# Patient Record
Sex: Male | Born: 1990 | Race: White | Hispanic: No | Marital: Married | Smoking: Current some day smoker
Health system: Southern US, Community
[De-identification: ages and names within clinical notes are randomized; demographics above are authoritative.]

## PROBLEM LIST (undated history)

## (undated) DIAGNOSIS — R030 Elevated blood-pressure reading, without diagnosis of hypertension: Secondary | ICD-10-CM

## (undated) DIAGNOSIS — I10 Essential (primary) hypertension: Secondary | ICD-10-CM

## (undated) HISTORY — DX: Essential (primary) hypertension: I10

## (undated) HISTORY — PX: FOOT SURGERY: SHX648

## (undated) HISTORY — PX: INCISION AND DRAINAGE ABSCESS: SHX5864

---
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
## 2013-07-13 ENCOUNTER — Emergency Department (HOSPITAL_COMMUNITY)
Admission: EM | Admit: 2013-07-13 | Discharge: 2013-07-13 | Disposition: A | Discharge status: Home / Self Care | Payer: Self-pay | Attending: Emergency Medicine | Admitting: Emergency Medicine

## 2013-07-13 ENCOUNTER — Encounter (HOSPITAL_COMMUNITY): Payer: Self-pay | Admitting: Emergency Medicine

## 2013-07-13 ENCOUNTER — Emergency Department (HOSPITAL_COMMUNITY): Payer: Self-pay

## 2013-07-13 DIAGNOSIS — R55 Syncope and collapse: Secondary | ICD-10-CM | POA: Insufficient documentation

## 2013-07-13 DIAGNOSIS — Y9389 Activity, other specified: Secondary | ICD-10-CM | POA: Insufficient documentation

## 2013-07-13 DIAGNOSIS — T401X1A Poisoning by heroin, accidental (unintentional), initial encounter: Secondary | ICD-10-CM | POA: Insufficient documentation

## 2013-07-13 DIAGNOSIS — Y929 Unspecified place or not applicable: Secondary | ICD-10-CM | POA: Insufficient documentation

## 2013-07-13 DIAGNOSIS — T401X4A Poisoning by heroin, undetermined, initial encounter: Secondary | ICD-10-CM | POA: Insufficient documentation

## 2013-07-13 DIAGNOSIS — R404 Transient alteration of awareness: Secondary | ICD-10-CM | POA: Insufficient documentation

## 2013-07-13 LAB — BASIC METABOLIC PANEL
Chloride: 104 mEq/L (ref 96–112)
GFR calc Af Amer: >90 mL/min (ref >90)
GFR calc non Af Amer: >90 mL/min (ref >90)
Potassium: 3.6 mEq/L (ref 3.5–5.1)
Sodium: 140 mEq/L (ref 135–145)

## 2013-07-13 LAB — CBC
Hemoglobin: 14.5 g/dL (ref 13.0–17.0)
MCHC: 33.6 g/dL (ref 30.0–36.0)
RDW: 13.6 % (ref 11.5–15.5)
WBC: 17.9 10*3/uL — ABNORMAL HIGH (ref 4.0–10.5)

## 2013-07-13 LAB — SALICYLATE LEVEL: Salicylate Lvl: <2 mg/dL — ABNORMAL LOW (ref 2.8–20.0)

## 2013-07-13 LAB — RAPID URINE DRUG SCREEN, HOSP PERFORMED
Barbiturates: NOT DETECTED
Benzodiazepines: NOT DETECTED
Cocaine: NOT DETECTED

## 2013-07-13 LAB — ACETAMINOPHEN LEVEL: Acetaminophen (Tylenol), Serum: <15 ug/mL (ref 10–30)

## 2013-07-13 LAB — POCT I-STAT TROPONIN I: Troponin i, poc: 0.01 ng/mL (ref 0.00–0.08)

## 2013-07-13 MED ORDER — SODIUM CHLORIDE 0.9 % IV BOLUS (SEPSIS)
1000.0000 mL | Freq: Once | INTRAVENOUS | Status: AC
  Administered 2013-07-13: 1000 mL via INTRAVENOUS

## 2013-07-13 NOTE — ED Notes (Signed)
Pt given soda and crackers per EDP order

## 2013-07-13 NOTE — ED Notes (Signed)
edp to bedside to discuss admission with pt due to low O2 sats on RA, pt remains from 86%-93% on RA with no complaints of SOB. Pt states he does not want to stay he will return if he feels SOB but he must work [employer]

## 2013-07-13 NOTE — ED Notes (Signed)
Pt states he took heroin aroun 730 this morning, sts he does not know how much he took. Pt remembers walking upstairs and then people standing around him on the floor. Pt position was on his back. Pt denies any symptoms, denies CP, SOB, N/V. States he has a slight headache 1/10. Pt in NAD, diaphoretic and tachycardic on monitor.

## 2013-07-13 NOTE — ED Notes (Signed)
Per ems, coming from friends house, found unconscious, shallow respirations, laying on floor. Friend states he overdosed on heroin. EMS had to bag pt, Pt was given 2 IM of narcan, pt first time using heroin. Upon arrival, pt A/O, ambulatory. VSS.

## 2013-07-13 NOTE — ED Provider Notes (Signed)
CSN: [id number]     Arrival date & time 07/13/13  0908 History   First MD Initiated Contact with Patient 07/13/13 0911     No chief complaint on file.  (Consider location/radiation/quality/duration/timing/severity/associated sxs/prior Treatment) HPI Comments: Injected heroin for the first time last night. Found by EMS unresponsive, shallow breathing. Narcan administered.  Patient is a 22 y.o. male presenting with Overdose and syncope. The history is provided by the patient and the EMS personnel.  Drug Overdose This is a new problem. The current episode started 1 to 2 hours ago. The problem occurs constantly. The problem has been resolved. Pertinent negatives include no chest pain, no abdominal pain and no shortness of breath. Nothing aggravates the symptoms. Relieved by: Narcan. He has tried nothing for the symptoms.  Loss of Consciousness Associated symptoms: no chest pain, no fever and no shortness of breath     No past medical history on file. No past surgical history on file. No family history on file. History  Substance Use Topics  . Smoking status: Not on file  . Smokeless tobacco: Not on file  . Alcohol Use: Not on file    Review of Systems  Constitutional: Negative for fever.  Respiratory: Negative for cough and shortness of breath.   Cardiovascular: Positive for syncope. Negative for chest pain.  Gastrointestinal: Negative for abdominal pain.  All other systems reviewed and are negative.    Allergies  Review of patient's allergies indicates no known allergies.  Home Medications  No current outpatient prescriptions on file. SpO2 100% Physical Exam  Nursing note and vitals reviewed. Constitutional: He is oriented to person, place, and time. He appears well-developed and well-nourished. No distress.  HENT:  Head: Normocephalic and atraumatic.  Mouth/Throat: No oropharyngeal exudate.  Eyes: EOM are normal. Pupils are equal, round, and reactive to light.  Neck:  Normal range of motion. Neck supple.  Cardiovascular: Normal rate and regular rhythm.  Exam reveals no friction rub.   No murmur heard. Pulmonary/Chest: Effort normal and breath sounds normal. No respiratory distress. He has no wheezes. He has no rales.  Abdominal: He exhibits no distension. There is no tenderness. There is no rebound.  Musculoskeletal: Normal range of motion. He exhibits no edema.  Neurological: He is alert and oriented to person, place, and time.  Skin: He is not diaphoretic.    ED Course  Procedures (including critical care time) Labs Review Labs Reviewed - No data to display Imaging Review No results found.  EKG Interpretation    Date/Time:  Monday July 13 2013 09:23:28 EST Ventricular Rate:  107 PR Interval:  179 QRS Duration: 102 QT Interval:  365 QTC Calculation: 487 R Axis:   69 Text Interpretation:  Sinus tachycardia Borderline ST elevation, anterolateral leads, c/w benign early repolarization Borderline prolonged QT interval Baseline wander in lead(s) V2 V3 Confirmed by Gwendolyn Grant  MD, Meron Bocchino ([id number]) on 07/13/2013 11:32:37 AM            MDM   1. Heroin overdose, initial encounter    57M here for overdose. Injected heroin for the first time this morning. Found by EMS with AMS, bradypnea, pinpoint pupils. 2mg  of Narcan given IM with good response. Here, alert, denies any SI, HI. Denies any other drug ingestion. Tachycardic on the monitor, satting in the low 90s with 2 L [place]. Exam otherwise benign. EKG with J-point elevation anteriorly, no ST depression, likely secondary to BER. Patient relaxing comfortably, no further signs of opiate intoxication. Alert and oriented. Patient  sleeps, but is easily arousable. He did endorse staying up most of the night partying. No pinpoint pupils. While observing patient, he was noted to have low O2 sats at many times while sleeping. Patient would wake up, take some deep breaths, and his sats would improve. CXR clear.  Upon re-exam, sats would hover in the high 80s. I explained he would like to observe him with an admission due to his hypoxia. He does not want to stay. He is alert, oriented, and aware of the risks. He signed AMA paperwork. He does not want to commit suicide, no need for Psych eval. Given discharge paperwork.     Dagmar Hait, MD 07/14/13 [phone]

## 2013-08-17 ENCOUNTER — Emergency Department (HOSPITAL_COMMUNITY)
Admission: EM | Admit: 2013-08-17 | Discharge: 2013-08-17 | Disposition: A | Discharge status: Home / Self Care | Payer: Self-pay | Attending: Emergency Medicine | Admitting: Emergency Medicine

## 2013-08-17 ENCOUNTER — Encounter (HOSPITAL_COMMUNITY): Payer: Self-pay | Admitting: Emergency Medicine

## 2013-08-17 DIAGNOSIS — Y939 Activity, unspecified: Secondary | ICD-10-CM | POA: Insufficient documentation

## 2013-08-17 DIAGNOSIS — Y929 Unspecified place or not applicable: Secondary | ICD-10-CM | POA: Insufficient documentation

## 2013-08-17 DIAGNOSIS — T401X1A Poisoning by heroin, accidental (unintentional), initial encounter: Secondary | ICD-10-CM | POA: Insufficient documentation

## 2013-08-17 DIAGNOSIS — F17200 Nicotine dependence, unspecified, uncomplicated: Secondary | ICD-10-CM | POA: Insufficient documentation

## 2013-08-17 DIAGNOSIS — T401X4A Poisoning by heroin, undetermined, initial encounter: Secondary | ICD-10-CM | POA: Insufficient documentation

## 2013-08-17 LAB — CBC WITH DIFFERENTIAL/PLATELET
Basophils Relative: 0 % (ref 0–1)
Eosinophils Absolute: 0.3 10*3/uL (ref 0.0–0.7)
Hemoglobin: 15.5 g/dL (ref 13.0–17.0)
Lymphs Abs: 2.4 10*3/uL (ref 0.7–4.0)
MCH: 29.4 pg (ref 26.0–34.0)
MCV: 86 fL (ref 78.0–100.0)
Monocytes Absolute: 1 10*3/uL (ref 0.1–1.0)
Monocytes Relative: 7 % (ref 3–12)
Neutrophils Relative %: 74 % (ref 43–77)
RBC: 5.28 MIL/uL (ref 4.22–5.81)
RDW: 13.3 % (ref 11.5–15.5)
WBC: 14 10*3/uL — ABNORMAL HIGH (ref 4.0–10.5)

## 2013-08-17 LAB — COMPREHENSIVE METABOLIC PANEL
ALT: 78 U/L — ABNORMAL HIGH (ref 0–53)
Albumin: 4 g/dL (ref 3.5–5.2)
Alkaline Phosphatase: 44 U/L (ref 39–117)
BUN: 13 mg/dL (ref 6–23)
CO2: 28 mEq/L (ref 19–32)
GFR calc Af Amer: >90 mL/min (ref >90)
GFR calc non Af Amer: >90 mL/min (ref >90)
Glucose, Bld: 192 mg/dL — ABNORMAL HIGH (ref 70–99)
Potassium: 3.6 mEq/L (ref 3.5–5.1)
Total Bilirubin: 0.2 mg/dL — ABNORMAL LOW (ref 0.3–1.2)
Total Protein: 7.4 g/dL (ref 6.0–8.3)

## 2013-08-17 LAB — SALICYLATE LEVEL: Salicylate Lvl: <2 mg/dL — ABNORMAL LOW (ref 2.8–20.0)

## 2013-08-17 LAB — ACETAMINOPHEN LEVEL: Acetaminophen (Tylenol), Serum: <15 ug/mL (ref 10–30)

## 2013-08-17 LAB — RAPID URINE DRUG SCREEN, HOSP PERFORMED
Amphetamines: NOT DETECTED
Barbiturates: NOT DETECTED
Benzodiazepines: POSITIVE — AB
Tetrahydrocannabinol: POSITIVE — AB

## 2013-08-17 LAB — ETHANOL: Alcohol, Ethyl (B): <11 mg/dL (ref 0–11)

## 2013-08-17 MED ORDER — NALOXONE HCL 1 MG/ML IJ SOLN
1.5000 mg/h | INTRAVENOUS | Status: DC
  Administered 2013-08-17: 1.5 mg/h via INTRAVENOUS
  Filled 2013-08-17: qty 4

## 2013-08-17 NOTE — ED Notes (Addendum)
Presents with accidental heroin overdose , took a little more than usual. Given 2 mg intranasal of narcan due to respiratory depression by fire and EMS. Upon arrival pt is alert, airway intact, VSS.  Sats 95% on 2 L O2. IV heroin user.

## 2013-08-17 NOTE — ED Provider Notes (Signed)
CSN: [id number]     Arrival date & time 08/17/13  0122 History   First MD Initiated Contact with Patient 08/17/13 0134     Chief Complaint  Patient presents with  . Drug Overdose   (Consider location/radiation/quality/duration/timing/severity/associated sxs/prior Treatment) Patient is a 22 y.o. male presenting with Overdose. The history is provided by the patient.  Drug Overdose  He accidentally overdosed 1 intravenous heroin. He took heroin at about 12:30 AM. He was found by EMS and given naloxone 2 mg with improvement. He denies using any other drugs tonight although he has used marijuana in the past. He had also been seen in the ED for her one overdose about one month ago. He has no complaints currently.  History reviewed. No pertinent past medical history. Past Surgical History  Procedure Laterality Date  . Foot surgery     History reviewed. No pertinent family history. History  Substance Use Topics  . Smoking status: Current Some Day Smoker  . Smokeless tobacco: Not on file  . Alcohol Use: Yes     Comment: not regularly    Review of Systems  All other systems reviewed and are negative.    Allergies  Review of patient's allergies indicates no known allergies.  Home Medications  No current outpatient prescriptions on file. BP 126/72  Pulse 113  Temp(Src) 98.9 F (37.2 C) (Oral)  Resp 14  SpO2 96% Physical Exam  Nursing note and vitals reviewed.  22 year old male, resting comfortably and in no acute distress. Vital signs are significant for tachycardia with heart rate of 113. Oxygen saturation is 96%, which is normal. Head is normocephalic and atraumatic. PERRLA, EOMI. Oropharynx is clear. Neck is nontender and supple without adenopathy or JVD. Back is nontender and there is no CVA tenderness. Lungs are clear without rales, wheezes, or rhonchi. Chest is nontender. Heart has regular rate and rhythm without murmur. Abdomen is soft, flat, nontender without masses  or hepatosplenomegaly and peristalsis is normoactive. Extremities have no cyanosis or edema, full range of motion is present. Skin is warm and dry without rash. Neurologic: Mental status is normal, cranial nerves are intact, there are no motor or sensory deficits.  ED Course  Procedures (including critical care time) Labs Review Results for orders placed during the hospital encounter of 08/17/13  CBC WITH DIFFERENTIAL      Result Value Range   WBC 14.0 (*) 4.0 - 10.5 K/uL   RBC 5.28  4.22 - 5.81 MIL/uL   Hemoglobin 15.5  13.0 - 17.0 g/dL   HCT 40.9  81.1 - 91.4 %   MCV 86.0  78.0 - 100.0 fL   MCH 29.4  26.0 - 34.0 pg   MCHC 34.1  30.0 - 36.0 g/dL   RDW 78.2  95.6 - 21.3 %   Platelets 313  150 - 400 K/uL   Neutrophils Relative % 74  43 - 77 %   Neutro Abs 10.3 (*) 1.7 - 7.7 K/uL   Lymphocytes Relative 17  12 - 46 %   Lymphs Abs 2.4  0.7 - 4.0 K/uL   Monocytes Relative 7  3 - 12 %   Monocytes Absolute 1.0  0.1 - 1.0 K/uL   Eosinophils Relative 2  0 - 5 %   Eosinophils Absolute 0.3  0.0 - 0.7 K/uL   Basophils Relative 0  0 - 1 %   Basophils Absolute 0.1  0.0 - 0.1 K/uL  COMPREHENSIVE METABOLIC PANEL      Result  Value Range   Sodium 140  135 - 145 mEq/L   Potassium 3.6  3.5 - 5.1 mEq/L   Chloride 100  96 - 112 mEq/L   CO2 28  19 - 32 mEq/L   Glucose, Bld 192 (*) 70 - 99 mg/dL   BUN 13  6 - 23 mg/dL   Creatinine, Ser 1.61  0.50 - 1.35 mg/dL   Calcium 9.0  8.4 - 09.6 mg/dL   Total Protein 7.4  6.0 - 8.3 g/dL   Albumin 4.0  3.5 - 5.2 g/dL   AST 70 (*) 0 - 37 U/L   ALT 78 (*) 0 - 53 U/L   Alkaline Phosphatase 44  39 - 117 U/L   Total Bilirubin 0.2 (*) 0.3 - 1.2 mg/dL   GFR calc non Af Amer >90  >90 mL/min   GFR calc Af Amer >90  >90 mL/min  ETHANOL      Result Value Range   Alcohol, Ethyl (B) <11  0 - 11 mg/dL  SALICYLATE LEVEL      Result Value Range   Salicylate Lvl <2.0 (*) 2.8 - 20.0 mg/dL  ACETAMINOPHEN LEVEL      Result Value Range   Acetaminophen (Tylenol),  Serum <15.0  10 - 30 ug/mL   CRITICAL CARE Performed by: EAVWU,JWJXB Total critical care time: 60 minutes Critical care time was exclusive of separately billable procedures and treating other patients. Critical care was necessary to treat or prevent imminent or life-threatening deterioration. Critical care was time spent personally by me on the following activities: development of treatment plan with patient and/or surrogate as well as nursing, discussions with consultants, evaluation of patient's response to treatment, examination of patient, obtaining history from patient or surrogate, ordering and performing treatments and interventions, ordering and review of laboratory studies, ordering and review of radiographic studies, pulse oximetry and re-evaluation of patient's condition.  MDM   1. Heroin overdose, initial encounter    Accidental heroin overdose. Because half-life of heroin is significantly longer than the half life of naloxone, he will need to be started on a naloxone drip. He wanted to be observed in the ED. Old records are reviewed and when he presented last month with her one overdose, hospital admission was recommended and he left AGAINST MEDICAL ADVICE. The patient states that he left because he had to go to work.  He was maintained on a naloxone drip for 4 hours. Following that, the naloxone drip was discontinued and he was observed in the ED for an additional hour. He is not showing any signs of sedation or hypoxia and at this point can be safely discharged. He is urged not to use heroin again. He is given resource guide to help find appropriate rehabilitation to keep him from going back to her when use.  Dione Booze, MD 08/17/13 [phone]

## 2013-09-11 ENCOUNTER — Emergency Department (HOSPITAL_COMMUNITY)
Admission: EM | Admit: 2013-09-11 | Discharge: 2013-09-11 | Disposition: A | Discharge status: Home / Self Care | Payer: Self-pay | Attending: Emergency Medicine | Admitting: Emergency Medicine

## 2013-09-11 ENCOUNTER — Other Ambulatory Visit: Payer: Self-pay

## 2013-09-11 ENCOUNTER — Encounter (HOSPITAL_COMMUNITY): Payer: Self-pay | Admitting: Emergency Medicine

## 2013-09-11 DIAGNOSIS — Y9389 Activity, other specified: Secondary | ICD-10-CM | POA: Insufficient documentation

## 2013-09-11 DIAGNOSIS — T40901A Poisoning by unspecified psychodysleptics [hallucinogens], accidental (unintentional), initial encounter: Secondary | ICD-10-CM | POA: Insufficient documentation

## 2013-09-11 DIAGNOSIS — IMO0002 Reserved for concepts with insufficient information to code with codable children: Secondary | ICD-10-CM | POA: Insufficient documentation

## 2013-09-11 DIAGNOSIS — F19929 Other psychoactive substance use, unspecified with intoxication, unspecified: Secondary | ICD-10-CM

## 2013-09-11 DIAGNOSIS — T40904A Poisoning by unspecified psychodysleptics [hallucinogens], undetermined, initial encounter: Secondary | ICD-10-CM | POA: Insufficient documentation

## 2013-09-11 DIAGNOSIS — Y929 Unspecified place or not applicable: Secondary | ICD-10-CM | POA: Insufficient documentation

## 2013-09-11 DIAGNOSIS — Z23 Encounter for immunization: Secondary | ICD-10-CM | POA: Insufficient documentation

## 2013-09-11 DIAGNOSIS — R000 Tachycardia, unspecified: Secondary | ICD-10-CM | POA: Insufficient documentation

## 2013-09-11 DIAGNOSIS — F911 Conduct disorder, childhood-onset type: Secondary | ICD-10-CM | POA: Insufficient documentation

## 2013-09-11 LAB — URINALYSIS, ROUTINE W REFLEX MICROSCOPIC
BILIRUBIN URINE: NEGATIVE
Glucose, UA: NEGATIVE mg/dL
HGB URINE DIPSTICK: NEGATIVE
KETONES UR: NEGATIVE mg/dL
Leukocytes, UA: NEGATIVE
Nitrite: NEGATIVE
PROTEIN: NEGATIVE mg/dL
Specific Gravity, Urine: 1.021 (ref 1.005–1.030)
UROBILINOGEN UA: 0.2 mg/dL (ref 0.0–1.0)
pH: 7.5 (ref 5.0–8.0)

## 2013-09-11 LAB — CK: Total CK: 205 U/L (ref 7–232)

## 2013-09-11 LAB — CBC WITH DIFFERENTIAL/PLATELET
BASOS PCT: 0 % (ref 0–1)
Basophils Absolute: 0 10*3/uL (ref 0.0–0.1)
EOS ABS: 0.2 10*3/uL (ref 0.0–0.7)
Eosinophils Relative: 1 % (ref 0–5)
HCT: 47 % (ref 39.0–52.0)
Hemoglobin: 16 g/dL (ref 13.0–17.0)
Lymphocytes Relative: 17 % (ref 12–46)
Lymphs Abs: 3.4 10*3/uL (ref 0.7–4.0)
MCH: 29 pg (ref 26.0–34.0)
MCHC: 34 g/dL (ref 30.0–36.0)
MCV: 85.3 fL (ref 78.0–100.0)
MONOS PCT: 7 % (ref 3–12)
Monocytes Absolute: 1.3 10*3/uL — ABNORMAL HIGH (ref 0.1–1.0)
NEUTROS PCT: 76 % (ref 43–77)
Neutro Abs: 15.1 10*3/uL — ABNORMAL HIGH (ref 1.7–7.7)
Platelets: 288 10*3/uL (ref 150–400)
RBC: 5.51 MIL/uL (ref 4.22–5.81)
RDW: 13.6 % (ref 11.5–15.5)
WBC: 19.9 10*3/uL — ABNORMAL HIGH (ref 4.0–10.5)

## 2013-09-11 LAB — RAPID URINE DRUG SCREEN, HOSP PERFORMED
Amphetamines: NOT DETECTED
BARBITURATES: NOT DETECTED
Benzodiazepines: NOT DETECTED
Cocaine: NOT DETECTED
Opiates: NOT DETECTED
TETRAHYDROCANNABINOL: POSITIVE — AB

## 2013-09-11 LAB — POCT I-STAT, CHEM 8
BUN: 14 mg/dL (ref 6–23)
Calcium, Ion: 1.18 mmol/L (ref 1.12–1.23)
Chloride: 109 mEq/L (ref 96–112)
Creatinine, Ser: 1 mg/dL (ref 0.50–1.35)
Glucose, Bld: 130 mg/dL — ABNORMAL HIGH (ref 70–99)
HCT: 51 % (ref 39.0–52.0)
HEMOGLOBIN: 17.3 g/dL — AB (ref 13.0–17.0)
POTASSIUM: 3.9 meq/L (ref 3.7–5.3)
SODIUM: 144 meq/L (ref 137–147)
TCO2: 22 mmol/L (ref 0–100)

## 2013-09-11 LAB — COMPREHENSIVE METABOLIC PANEL
ALT: 63 U/L — AB (ref 0–53)
AST: 38 U/L — ABNORMAL HIGH (ref 0–37)
Albumin: 4.7 g/dL (ref 3.5–5.2)
Alkaline Phosphatase: 48 U/L (ref 39–117)
BUN: 14 mg/dL (ref 6–23)
CO2: 22 meq/L (ref 19–32)
Calcium: 10.3 mg/dL (ref 8.4–10.5)
Chloride: 105 mEq/L (ref 96–112)
Creatinine, Ser: 0.91 mg/dL (ref 0.50–1.35)
Glucose, Bld: 126 mg/dL — ABNORMAL HIGH (ref 70–99)
POTASSIUM: 4.2 meq/L (ref 3.7–5.3)
Sodium: 145 mEq/L (ref 137–147)
Total Bilirubin: 0.5 mg/dL (ref 0.3–1.2)
Total Protein: 8.3 g/dL (ref 6.0–8.3)

## 2013-09-11 LAB — GLUCOSE, CAPILLARY: Glucose-Capillary: 114 mg/dL — ABNORMAL HIGH (ref 70–99)

## 2013-09-11 LAB — TROPONIN I

## 2013-09-11 LAB — ETHANOL

## 2013-09-11 LAB — CG4 I-STAT (LACTIC ACID): LACTIC ACID, VENOUS: 2.65 mmol/L — AB (ref 0.5–2.2)

## 2013-09-11 MED ORDER — LORAZEPAM 2 MG/ML IJ SOLN
1.0000 mg | Freq: Once | INTRAMUSCULAR | Status: AC
  Administered 2013-09-11: 1 mg via INTRAVENOUS

## 2013-09-11 MED ORDER — SODIUM CHLORIDE 0.9 % IV SOLN
1000.0000 mL | Freq: Once | INTRAVENOUS | Status: AC
  Administered 2013-09-11: 1000 mL via INTRAVENOUS

## 2013-09-11 MED ORDER — NICOTINE 14 MG/24HR TD PT24
14.0000 mg | MEDICATED_PATCH | Freq: Once | TRANSDERMAL | Status: DC
  Administered 2013-09-11: 14 mg via TRANSDERMAL
  Filled 2013-09-11: qty 1

## 2013-09-11 MED ORDER — TETANUS-DIPHTH-ACELL PERTUSSIS 5-2.5-18.5 LF-MCG/0.5 IM SUSP
0.5000 mL | Freq: Once | INTRAMUSCULAR | Status: AC
  Administered 2013-09-11: 0.5 mL via INTRAMUSCULAR
  Filled 2013-09-11: qty 0.5

## 2013-09-11 MED ORDER — SODIUM CHLORIDE 0.9 % IV SOLN
1000.0000 mL | INTRAVENOUS | Status: DC
  Administered 2013-09-11: 1000 mL via INTRAVENOUS

## 2013-09-11 MED ORDER — LORAZEPAM 2 MG/ML IJ SOLN
INTRAMUSCULAR | Status: AC
  Filled 2013-09-11: qty 1

## 2013-09-11 NOTE — ED Notes (Signed)
Still won't share information on who he is, where he is, etc.

## 2013-09-11 NOTE — ED Notes (Signed)
Pt taken out handcuffs at this time. More alert and responding to staff.

## 2013-09-11 NOTE — Discharge Instructions (Signed)
°Emergency Department Resource Guide °1) Find a Doctor and Pay Out of Pocket °Although you won't have to find out who is covered by your insurance plan, it is a good idea to ask around and get recommendations. You will then need to call the office and see if the doctor you have chosen will accept you as a new patient and what types of options they offer for patients who are self-pay. Some doctors offer discounts or will set up payment plans for their patients who do not have insurance, but you will need to ask so you aren't surprised when you get to your appointment. ° °2) Contact Your Local Health Department °Not all health departments have doctors that can see patients for sick visits, but many do, so it is worth a call to see if yours does. If you don't know where your local health department is, you can check in your phone book. The CDC also has a tool to help you locate your state's health department, and many state websites also have listings of all of their local health departments. ° °3) Find a Walk-in Clinic °If your illness is not likely to be very severe or complicated, you may want to try a walk in clinic. These are popping up all over the country in pharmacies, drugstores, and shopping centers. They're usually staffed by nurse practitioners or physician assistants that have been trained to treat common illnesses and complaints. They're usually fairly quick and inexpensive. However, if you have serious medical issues or chronic medical problems, these are probably not your best option. ° °No Primary Care Doctor: °- Call [facility] at  832-8000 - they can help you locate a primary care doctor that  accepts your insurance, provides certain services, etc. °- Physician Referral Service- 1-800-533-3463 ° °Chronic Pain Problems: °Organization         Address  Phone   Notes  °[facility]  (336) 297-2271 Patients need to be referred by their primary care doctor.  ° °Medication  Assistance: °Organization         Address  Phone   Notes  °[place] [facility] [address] °[place], [place] [address] (336) 641-8030 --Must be a resident of [place] °-- Must have NO insurance coverage whatsoever (no Medicaid/ Medicare, etc.) °-- The pt. MUST have a primary care doctor that directs their care regularly and follows them in the community °  °[facility]  (866) 331-1348   °[employer]  (888) 892-1162   ° °Agencies that provide inexpensive medical care: °Organization         Address  Phone   Notes  °[facility]  (336) 832-8035   °[facility]    (336) 832-7272   °[facility] [address] °[place], [place] [address] (336) 832-4777   °[facility] [address], °[place] (336) 271-4999   °[facility]    (336) 373-0678   °[facility]    (336) 272-1050   °[facility] ° [address], [place] Phone:  (336) 832-4444, Fax:  (336) 832-4440 Hours of Operation:  9 am - 6 pm, M-F.  Also accepts Medicaid/Medicare and self-pay.  °[facility] ° [address], [place] Phone: (336) 832-3150, Fax: (336) [phone]. Hours of Operation:  8:30 am - 5:30 pm, M-F.  Also accepts Medicaid and self-pay.  °[facility] [place] [address], [place] Phone: (336) 878-6027   °[facility] [address], [place], [place] (336)723-1848, Ext. 123 Mondays & Thursdays: 7-9 AM.  First 15 patients are seen on a first come, first serve basis. °  ° °Medicaid-accepting [place] Providers: ° °Organization         Address  Phone   Notes  °[facility] [address], [place] (336) 641-2100 Also accepts self-pay patients.  °[facility] [address], [place] ° (336) 856-9996   °[facility] [address], [place]  (336) 288-8857   °[facility] [address] [place] [address], [place] (336) 299-7000   °Veita Bland [address], [place]  ° (336) 373-1557 Only accepts  Access Medicaid patients after they have their name applied to their card.  ° °Self-Pay (no insurance) in [place]: ° °Organization         Address  Phone   Notes  °Sickle Cell Patients, [facility] [address], [place] (336) 832-1970   °[facility] [address], [place] (336) 832-4400   °[facility] ° [address], [place] (336) 992-4800   °[facility]/Dr. Osei-Bonsu ° [address] [place] [address], [place] or [address], [place] (336) [phone] Phone number for both [place] and [place] locations is the same.  °[facility] [address], [place] (336) 299-0000   °[facility] [address] [place] [address], [place] or [address] (336) 852-7530 °(336) 878-2260   °[facility] [address], [place] (336) 350-1642, phone; (336) 294-5005, fax Sees patients 1st and 3rd Saturday of every month.  Must not qualify for public or private insurance (i.e. Medicaid, Medicare, [place] Health Choice, Veterans' Benefits) • Household income should be no more than 200% of the poverty level •The clinic cannot treat you if you are pregnant or think you are pregnant • Sexually transmitted diseases are not treated at the clinic.  ° ° °Dental Care: °Organization         Address  Phone  Notes  °[place] [facility] [address], [place] (336) 641-6152 Accepts children up to age 21 who are enrolled in Medicaid or [place] Health Choice; pregnant women with a Medicaid card; and children who have applied for Medicaid or [place] Health Choice, but were declined, whose parents can pay a reduced fee at time of service.  °[place]  [facility] [place]  [address], [place] (336) 641-7733 Accepts children up to age 21 who are enrolled in Medicaid or [place] Health Choice; pregnant women with a Medicaid card; and children who have applied for Medicaid or [place] Health Choice, but were declined, whose parents can pay a reduced fee at time of service.  °[facility] ° [address], [place] (336) 641-4533 Patients are seen by appointment only. Walk-ins are not accepted. [facility] will see patients 18 years of age and older. °Monday - Tuesday (8am-5pm) °Most Wednesdays (8:30-5pm) °$30 per visit, cash only  °[facility] ° [address], [place] (336) 641-4533 Patients are seen by appointment only. Walk-ins are not accepted. [facility] will see patients 18 years of age and older. °One   Wednesday Evening (Monthly: Volunteer Based).  $30 per visit, cash only  °[facility]  (919) 537-3737 for adults; Children under age 4, call Graduate Pediatric Dentistry at (919) [phone]. Children aged 4-14, please call (919) [phone] to request a pediatric application. ° Dental services are provided in all areas of dental care including fillings, crowns and bridges, complete and partial dentures, implants, gum treatment, root canals, and extractions. Preventive care is also provided. Treatment is provided to both adults and children. °Patients are selected via a lottery and there is often a waiting list. °  °[facility] [address], °[place] ° (336) 763-8833 [email or web address] °  °[facility] [address], [place], [place] (336)723-1848, Ext. 123 Second and Fourth Thursday of each month, opens at 6:30 AM; Clinic ends at 9 AM.  Patients are seen on a first-come first-served basis, and a limited number are seen during each clinic.  ° °[facility] ° [address], [place], [place] (336) 723-7904    Eligibility Requirements °You must have lived in [place], or [place] counties for at least the last three months. °  You cannot be eligible for state or federal sponsored healthcare insurance, including Veterans Administration, Medicaid, or Medicare. °  You generally cannot be eligible for healthcare insurance through your employer.  °  How to apply: °Eligibility screenings are held every Tuesday and Wednesday afternoon from 1:00 pm until 4:00 pm. You do not need an appointment for the interview!  °[facility] [address], [place], [place] 336-631-2330   °[facility]  336-342-8273   °[facility]  336-703-3100   °[facility]  336-570-6415   ° °Behavioral Health Resources in the Community: °Intensive Outpatient Programs °Organization         Address  Phone  Notes  °[facility] [address], [place], [place] 336-878-6098   °[facility] [address], [place], [place] 336-832-9800   °[facility] [address], [place], [place] ° 336-882-2125   °[facility] [address],  °[place], [place] 1-800-853-5163 or 336-641-4981   °Substance Abuse Resources °Organization         Address  Phone  Notes  °[facility]  336-882-2125   °[facility]  336-784-9470   °[facility]  336-285-9073   °[facility]  336-845-3988   °Residential & Outpatient Substance Abuse Program  1-800-659-3381   °Psychological Services °Organization         Address  Phone  Notes  °[facility]  336- 832-9600   °[facility]  336- 378-7881   °[facility] [address], [place] 1-800-853-5163 or 336-641-4981   ° °Mobile Crisis Teams °Organization         Address  Phone  Notes  °[facility], Mobile Crisis Care Unit  1-877-626-1772   °Assertive °[facility] ° [address]  [place], [place] 336-834-9664   °Sharon DeEsch [address] °[place] 336-554-5454   ° °Self-Help/Support Groups °Organization         Address  Phone             Notes  °[facility] [place] - variety of support groups  336- 373-1402 Call for more information  °Narcotics Anonymous (NA), [facility] [address], °[place]   2 meetings at this location  ° °  Residential Treatment Programs °Organization         Address  Phone  Notes  °[facility] [address],    °[place]  1-866-801-8205   °[facility] ° [address], [place], [place] 704-293-8524   °[facility] [address], [place] 336-845-3988 Admissions: 8am-3pm M-F  °[facility] [address]    °[place], [place] 336-841-1104   °[facility] [address], [place], [place] 336-379-7146   °[facility] [address]  °[place], [place] 336-285-9073   °[facility] - Intensive Outpatient [address], [place], [place] 336-852-3033   °[facility] [address]  °[place], [place] 1-877-615-2722 or 336-784-9470   °[facility] [address] [place], [place] 336-227-7417 Accepts Medicaid  °[facility] [address]  °[place] 1-800-659-3381 Substance Abuse/Addiction Treatment  ° °[place] Behavioral Health Resources °Organization         Address  Phone  Notes  °[facility]  (888) 581-9988   °Julie Brannon, PhD [address] [place], [place]   (336) 349-5553 or (336) 951-0000   °[facility]   [address] °[place], [place] (336) 349-4454   °[facility] [address], [place], [place] (336) 342-8316 Insurance/Medicaid/sponsorship through [facility]  °[facility] [address]                                    [place], [place] (336) 342-8316 Therapy/tele-psych/case    °[facility] [address]  ° [place], [place] (336) 349-2233    °Dr. Arfeen  (336) 349-4544   °[facility] [place]  [employer] [place] [facility] 1) [address], [place] °2) [address], [place] °3)  [address], [place] (336) 349-3220 °(336) 342-7768 ° °(336) 342-8140   °[place] [facility] (336) 342-1394 or (336) 342-3537 (After Hours)    ° ° °
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
## 2013-09-11 NOTE — ED Notes (Signed)
Pt unable to urinate at this time. MD made aware. If pt unable to void soon will get an in and out per MD.
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
## 2013-09-11 NOTE — ED Notes (Signed)
AC notified of need for sitter at bedside.

## 2013-09-11 NOTE — ED Provider Notes (Signed)
CSN: [id number]     Arrival date & time 09/11/13  0604 History   First MD Initiated Contact with Patient 09/11/13 [phone]     Chief Complaint  Patient presents with  . Altered Mental Status   (Consider location/radiation/quality/duration/timing/severity/associated sxs/prior Treatment) HPI Unknown age white male presents to emergency department via EMS with police escort.  Pt was found naked and agitated, breaking window in local campus apartment. Pt has not been able to communicate much aside from saying "I'm done".  "I don't know".  Pt cannot give name.  He reports doing drugs earlier with friends.  He reports "I am tired of waiting for the pain to stop"  Pt was tazed x 2, handcuffed, and was reportedly spitting, aggressive, and very agitated.   No past medical history on file. No past surgical history on file. No family history on file. History  Substance Use Topics  . Smoking status: Not on file  . Smokeless tobacco: Not on file  . Alcohol Use: Not on file   OB History   No data available     Review of Systems  Unable to perform ROS: Mental status change    Allergies  Review of patient's allergies indicates not on file.  Home Medications  No current outpatient prescriptions on file. There were no vitals taken for this visit. Physical Exam  Nursing note and vitals reviewed. Constitutional: He appears distressed.  Very large and tall gentleman, naked. Dried blood to face, abdomen, right hand, feet.  No source of blood noted aside from minor abrasions to feet.  HENT:  Head: Normocephalic and atraumatic.  Right Ear: External ear normal.  Left Ear: External ear normal.  Nose: Nose normal.  Mouth/Throat: Oropharynx is clear and moist. No oropharyngeal exudate.  Eyes:  Pupils dilated to 6, but reactive  Neck: Normal range of motion. Neck supple. No JVD present. No tracheal deviation present. No thyromegaly present.  Cardiovascular: Regular rhythm, normal heart sounds and intact  distal pulses.  Exam reveals no gallop and no friction rub.   No murmur heard. tachycardia  Pulmonary/Chest: Effort normal and breath sounds normal. No stridor. No respiratory distress. He has no wheezes. He has no rales. He exhibits no tenderness.  Abdominal: Soft. Bowel sounds are normal. He exhibits no distension and no mass. There is no tenderness. There is no rebound and no guarding.  Musculoskeletal: Normal range of motion. He exhibits no edema and no tenderness.  Lymphadenopathy:    He has no cervical adenopathy.  Neurological: He is alert. He exhibits normal muscle tone. Coordination normal.  Skin: Skin is warm and dry. No rash noted. No erythema. No pallor.  Psychiatric:  Unable to assess due to pt refusing or unable to answer questions    ED Course  Procedures (including critical care time) Labs Review Labs Reviewed  GLUCOSE, CAPILLARY  CK  COMPREHENSIVE METABOLIC PANEL  CBC WITH DIFFERENTIAL  URINALYSIS, ROUTINE W REFLEX MICROSCOPIC  URINE RAPID DRUG SCREEN (HOSP PERFORMED)  ETHANOL  POCT I-STAT, CHEM 8  CG4 I-STAT (LACTIC ACID)   Imaging Review No results found.  EKG Interpretation   None       Date: 09/11/2013  Rate: 132  Rhythm: sinus tachycardia  QRS Axis: left  Intervals: normal  ST/T Wave abnormalities: nonspecific ST/T changes  Conduction Disutrbances:none  Narrative Interpretation:   Old EKG Reviewed: none available    MDM  No diagnosis found. Altered male, possible drug intoxication/toxidrome.  Will give ativan 1 mg for tachycardia, agitation.  Will give 2 L NS fluids.  EKG with sinus tach.  Labs sent.  Care passed to Dr Criss Alvine awaiting remainder of labs and clearing of sensorium  Olivia Mackie, MD 09/12/13 [phone]

## 2013-09-11 NOTE — ED Notes (Signed)
Pt attempting to use the urinal.

## 2013-09-11 NOTE — ED Notes (Signed)
Pt ambulated without difficulty to bathroom, denies any complaints, cooperative-- oriented to place, time, date. Not sure what happened last night, but states smoked with a friend that he hadn't smoked with before.

## 2014-02-26 ENCOUNTER — Emergency Department (HOSPITAL_COMMUNITY)
Admission: EM | Admit: 2014-02-26 | Discharge: 2014-02-26 | Disposition: A | Discharge status: Home / Self Care | Payer: Self-pay | Attending: Emergency Medicine | Admitting: Emergency Medicine

## 2014-02-26 DIAGNOSIS — IMO0002 Reserved for concepts with insufficient information to code with codable children: Secondary | ICD-10-CM | POA: Insufficient documentation

## 2014-02-26 DIAGNOSIS — L0390 Cellulitis, unspecified: Secondary | ICD-10-CM

## 2014-02-26 DIAGNOSIS — F17200 Nicotine dependence, unspecified, uncomplicated: Secondary | ICD-10-CM | POA: Insufficient documentation

## 2014-02-26 DIAGNOSIS — L0291 Cutaneous abscess, unspecified: Secondary | ICD-10-CM

## 2014-02-26 MED ORDER — OXYCODONE-ACETAMINOPHEN 5-325 MG PO TABS: 1.0000 | ORAL_TABLET | Freq: Four times a day (QID) | ORAL | Status: DC | PRN

## 2014-02-26 MED ORDER — CEPHALEXIN 500 MG PO CAPS: ORAL_CAPSULE | ORAL | Status: DC

## 2014-02-26 MED ORDER — OXYCODONE-ACETAMINOPHEN 5-325 MG PO TABS
1.0000 | ORAL_TABLET | Freq: Once | ORAL | Status: AC
  Administered 2014-02-26: 1 via ORAL
  Filled 2014-02-26: qty 1

## 2014-02-26 MED ORDER — SULFAMETHOXAZOLE-TMP DS 800-160 MG PO TABS: 1.0000 | ORAL_TABLET | Freq: Two times a day (BID) | ORAL | Status: DC

## 2014-02-26 NOTE — ED Provider Notes (Signed)
CSN: [id number]     Arrival date & time 02/26/14  0004 History   First MD Initiated Contact with Patient 02/26/14 [phone]     Chief Complaint  Patient presents with  . Abscess     (Consider location/radiation/quality/duration/timing/severity/associated sxs/prior Treatment) HPI Comments: IVIS David Beltran is a 23 y.o. Previously healthy male who presents today with a draining abscess in his R antecubital area. States he noticed the area 6 days ago, and it became more swollen, until it ruptured 2 days ago. Has been draining purulent material. The forearm the became more red, swollen, and warm, which is what prompted him to come in last night. Pt states the pain is mild to moderate, aching, non-radiating, with no known aggravating or alleviating factors. Denies fevers, chills, fatigue, abd pain, n/v/d, CP, SOB, lesions elsewhere, myalgias or arthralgias. States he was keeping it covered with a cotton sock. Denies IV drug use, insect bite, skin injury, or hx of abscesses. Denies hx of DM.    Patient is a 23 y.o. male presenting with abscess. The history is provided by the patient. No language interpreter was used.  Abscess Location:  Shoulder/arm Shoulder/arm abscess location:  R elbow Size:  3cm Abscess quality: draining, induration, painful, redness and warmth   Abscess quality: no fluctuance and no itching   Red streaking: no   Duration:  6 days (6 days for abscess, 2 days for drainage) Progression:  Unchanged Pain details:    Quality:  Throbbing   Severity:  Moderate   Duration:  6 days   Timing:  Constant   Progression:  Unchanged Chronicity:  New Context: not diabetes, not immunosuppression, not injected drug use, not insect bite/sting and not skin injury   Relieved by:  Draining/squeezing Worsened by:  Nothing tried Ineffective treatments:  None tried Associated symptoms: no fatigue, no fever, no headaches, no nausea and no vomiting   Risk factors: no hx of MRSA and no prior abscess      No past medical history on file. Past Surgical History  Procedure Laterality Date  . Foot surgery Left    No family history on file. History  Substance Use Topics  . Smoking status: Current Some Day Smoker    Types: Cigarettes  . Smokeless tobacco: Not on file  . Alcohol Use: Yes     Comment: not regularly    Review of Systems  Constitutional: Negative for fever, chills, diaphoresis and fatigue.  Respiratory: Negative for shortness of breath.   Cardiovascular: Negative for chest pain.  Gastrointestinal: Negative for nausea, vomiting, abdominal pain and diarrhea.  Musculoskeletal: Negative for arthralgias, back pain, joint swelling, myalgias, neck pain and neck stiffness.  Skin: Positive for rash and wound.  Allergic/Immunologic: Negative for immunocompromised state.  Neurological: Negative for dizziness, weakness, light-headedness and headaches.  Hematological: Negative for adenopathy.  Psychiatric/Behavioral: Negative for confusion.  10 Systems reviewed and are negative for acute change except as noted in the HPI.     Allergies  Bee venom  Home Medications   Prior to Admission medications   Medication Sig Start Date End Date Taking? Authorizing Provider  cephALEXin (KEFLEX) 500 MG capsule 2 caps po bid x 7 days 02/26/14   Giannina Bartolome Strupp Camprubi-Soms, PA-C  oxyCODONE-acetaminophen (PERCOCET) 5-325 MG per tablet Take 1-2 tablets by mouth every 6 (six) hours as needed for severe pain. 02/26/14   Carlie Solorzano Strupp Camprubi-Soms, PA-C  sulfamethoxazole-trimethoprim (BACTRIM DS) 800-160 MG per tablet Take 1 tablet by mouth 2 (two) times daily. 02/26/14  Tanganyika Bowlds Strupp Camprubi-Soms, PA-C   BP 135/61  Pulse 84  Temp(Src) 98.7 F (37.1 C) (Oral)  Resp 14  Ht 6\' 4"  (1.93 m)  Wt 320 lb (145.151 kg)  BMI 38.97 kg/m2  SpO2 97% Physical Exam  Nursing note and vitals reviewed. Constitutional: He is oriented to person, place, and time. Vital signs are normal. He appears  well-developed and well-nourished. No distress.  Afebrile, non-toxic, NAD, pleasant and cooperative.  HENT:  Head: Normocephalic and atraumatic.  Mouth/Throat: Mucous membranes are normal.  Eyes: Conjunctivae and EOM are normal. Pupils are equal, round, and reactive to light. Right eye exhibits no discharge. Left eye exhibits no discharge.  Neck: Normal range of motion. Neck supple. No spinous process tenderness and no muscular tenderness present. No rigidity. Normal range of motion present.  Cardiovascular: Normal rate, regular rhythm, normal heart sounds and intact distal pulses.   No murmur heard. Pulmonary/Chest: Effort normal and breath sounds normal. He has no wheezes. He has no rales.  Abdominal: Soft. Normal appearance and bowel sounds are normal. He exhibits no distension. There is no tenderness. There is no rigidity, no rebound and no guarding.  Musculoskeletal: Normal range of motion.       Arms: R antecubital area TTP surrounding 3cm wound with purulent material draining. Abscess and cellulitis described below. Limited ROM secondary to pain and swelling. No bony TTP or crepitus. Digits fully functional, strength 5/5 in all extremities.  Neurological: He is alert and oriented to person, place, and time. He has normal strength. No sensory deficit.  Strength 5/5 in all extremities, sensation grossly intact in all extremities.  Skin: Skin is warm and dry. Lesion noted. No rash noted. There is erythema.     3cm open draining abscess wound with purulent material visualized. Area of induration surrounding wound approx 7cm in diameter, no fluctuance noted. Malodorous. Erythema extending into forearm, approx 60% down to the wrist. Erythematous skin warm to touch, mildly TTP.  Psychiatric: He has a normal mood and affect.    ED Course  Wound packing Date/Time: 02/26/2014 6:50 AM Performed by: CAMPRUBI-SOMS, Blue Winther STRUPP Authorized by: Ramond MarrowAMPRUBI-SOMS, Novah Goza STRUPP Consent: Verbal  consent obtained. Risks and benefits: risks, benefits and alternatives were discussed Consent given by: patient Patient understanding: patient states understanding of the procedure being performed Patient consent: the patient's understanding of the procedure matches consent given Patient identity confirmed: verbally with patient Local anesthesia used: yes Local anesthetic: lidocaine 2% with epinephrine Anesthetic total: 2 ml Patient sedated: no Patient tolerance: Patient tolerated the procedure well with no immediate complications. Comments: Wound injected with lido 2% w/ epi for comfort, then using forceps wound was debrided and prodded. Wound was then irrigated with copious amounts of NS. 1/4" iodoform was packed into wound, and dressing was applied. Pt tolerated procedure well. Wound culture was obtained and sent for examination   (including critical care time) Labs Review Labs Reviewed  WOUND CULTURE    Imaging Review No results found.   EKG Interpretation None      MDM   Final diagnoses:  Abscess and cellulitis    Thedore MinsJohnathan W Kossman is a 23 y.o. male previously Healthy, presenting for abscess that started draining 2 days ago. Open abscess draining Purulent and malodorous material, area of induration noted surrounding this wound, as well as cellulitic area extending down forearm. Wound was packed, culture sent, and surgical marker was used to delineate erythema edges. Do not feel imaging is necessary at this time, does not seem to  have deep tissue infection, and abscess has begun draining. Pt afebrile, non-toxic, and no complicating medical conditions. I feel this pt meets criteria for outpatient management with wound care f/up in 2 days at PCP/Urgent care, and bactrim/keflex. Rx for pain meds given. Of note, discussed pt's need to see PCP for eval of HTN, although I believe some of his high BPs could be explained by pain and anxiety. I explained the diagnosis and have given  explicit precautions to return to the ER including for any other new or worsening symptoms. The patient understands and accepts the medical plan as it's been dictated and I have answered their questions. Discharge instructions concerning home care and prescriptions have been given. The patient is STABLE and is discharged to home in good condition.  BP 135/61  Pulse 84  Temp(Src) 98.7 F (37.1 C) (Oral)  Resp 14  Ht 6\' 4"  (1.93 m)  Wt 320 lb (145.151 kg)  BMI 38.97 kg/m2  SpO2 97%   [employer], PA-C 02/26/14 [phone]

## 2014-02-26 NOTE — Discharge Instructions (Signed)
Keep wound clean and dry. Apply warm compresses to affected area throughout the day. Take antibiotic until it is finished. Take Percocet as directed, as needed for pain but do not drive or operate machinery with pain medication use. Followup with [facility]Primary Care doctor in 2 days for wound recheck and packing removal.  Return to emergency department for emergent changing or worsening symptoms.   Abscess An abscess (boil or furuncle) is an infected area on or under the skin. This area is filled with yellowish-white fluid (pus) and other material (debris). HOME CARE   Only take medicines as told by your doctor.  If you were given antibiotic medicine, take it as directed. Finish the medicine even if you start to feel better.  If gauze is used, follow your doctor's directions for changing the gauze.  To avoid spreading the infection:  Keep your abscess covered with a bandage.  Wash your hands well.  Do not share personal care items, towels, or whirlpools with others.  Avoid skin contact with others.  Keep your skin and clothes clean around the abscess.  Keep all doctor visits as told. GET HELP RIGHT AWAY IF:   You have more pain, puffiness (swelling), or redness in the wound site.  You have more fluid or blood coming from the wound site.  You have muscle aches, chills, or you feel sick.  You have a fever. MAKE SURE YOU:   Understand these instructions.  Will watch your condition.  Will get help right away if you are not doing well or get worse. Document Released: 01/23/2008 Document Revised: 02/05/2012 Document Reviewed: 10/19/2011 [facility] Patient Information 2015 [place], [place]. This information is not intended to replace advice given to you by your health care provider. Make sure you discuss any questions you have with your health care provider.  Abscess Care After An abscess (also called a boil or furuncle) is an infected area that contains a  collection of pus. Signs and symptoms of an abscess include pain, tenderness, redness, or hardness, or you may feel a moveable soft area under your skin. An abscess can occur anywhere in the body. The infection may spread to surrounding tissues causing cellulitis. A cut (incision) by the surgeon was made over your abscess and the pus was drained out. Gauze may have been packed into the space to provide a drain that will allow the cavity to heal from the inside outwards. The boil may be painful for 5 to 7 days. Most people with a boil do not have high fevers. Your abscess, if seen early, may not have localized, and may not have been lanced. If not, another appointment may be required for this if it does not get better on its own or with medications. HOME CARE INSTRUCTIONS   Only take over-the-counter or prescription medicines for pain, discomfort, or fever as directed by your caregiver.  When you bathe, soak and then remove gauze or iodoform packs at least daily or as directed by your caregiver. You may then wash the wound gently with mild soapy water. Repack with gauze or do as your caregiver directs. SEEK IMMEDIATE MEDICAL CARE IF:   You develop increased pain, swelling, redness, drainage, or bleeding in the wound site.  You develop signs of generalized infection including muscle aches, chills, fever, or a general ill feeling.  An oral temperature above 102 F (38.9 C) develops, not controlled by medication. See your caregiver for a recheck if you develop any of the symptoms described  above. If medications (antibiotics) were prescribed, take them as directed. Document Released: 02/22/2005 Document Revised: 10/29/2011 Document Reviewed: 10/20/2007 [employer] Patient Information 2015 [place], [place]. This information is not intended to replace advice given to you by your health care provider. Make sure you discuss any questions you have with your health care provider.  Cellulitis Cellulitis is an  infection of the skin and the tissue under the skin. The infected area is usually red and tender. This happens most often in the arms and lower legs. HOME CARE   Take your antibiotic medicine as told. Finish the medicine even if you start to feel better.  Keep the infected arm or leg raised (elevated).  Put a warm cloth on the area up to 4 times per day.  Only take medicines as told by your doctor.  Keep all doctor visits as told. GET HELP RIGHT AWAY IF:   You have a fever.  You feel very sleepy.  You throw up (vomit) or have watery poop (diarrhea).  You feel sick and have muscle aches and pains.  You see red streaks on the skin coming from the infected area.  Your red area gets bigger or turns a dark color.  Your bone or joint under the infected area is painful after the skin heals.  Your infection comes back in the same area or different area.  You have a puffy (swollen) bump in the infected area.  You have new symptoms. MAKE SURE YOU:   Understand these instructions.  Will watch your condition.  Will get help right away if you are not doing well or get worse. Document Released: 01/23/2008 Document Revised: 02/05/2012 Document Reviewed: 10/22/2011 [facility] Patient Information 2015 [place], [place]. This information is not intended to replace advice given to you by your health care provider. Make sure you discuss any questions you have with your health care provider.  Emergency Department Resource Guide 1) Find a Doctor and Pay Out of Pocket Although you won't have to find out who is covered by your insurance plan, it is a good idea to ask around and get recommendations. You will then need to call the office and see if the doctor you have chosen will accept you as a new patient and what types of options they offer for patients who are self-pay. Some doctors offer discounts or will set up payment plans for their patients who do not have insurance, but you will need to ask  so you aren't surprised when you get to your appointment.  2) Contact Your Local Health Department Not all health departments have doctors that can see patients for sick visits, but many do, so it is worth a call to see if yours does. If you don't know where your local health department is, you can check in your phone book. The CDC also has a tool to help you locate your state's health department, and many state websites also have listings of all of their local health departments.  3) Find a Walk-in Clinic If your illness is not likely to be very severe or complicated, you may want to try a walk in clinic. These are popping up all over the country in pharmacies, drugstores, and shopping centers. They're usually staffed by nurse practitioners or physician assistants that have been trained to treat common illnesses and complaints. They're usually fairly quick and inexpensive. However, if you have serious medical issues or chronic medical problems, these are probably not your best option.  No Primary Care Doctor: -  Doctor: °- Call Health Connect at  832-8000 - they can help you locate a primary care doctor that  accepts your insurance, provides certain services, etc. °- Physician Referral Service- 1-800-533-3463 ° °Chronic Pain Problems: °Organization         Address  Phone   Notes  °[facility]  (336) 297-2271 Patients need to be referred by their primary care doctor.  ° °Medication Assistance: °Organization         Address  Phone   Notes  °[place] [facility] [address] °[place], [place] [address] (336) 641-8030 --Must be a resident of [place] °-- Must have NO insurance coverage whatsoever (no Medicaid/ Medicare, etc.) °-- The pt. MUST have a primary care doctor that directs their care regularly and follows them in the community °  °[facility]  (866) 331-1348   °[employer]  (888) 892-1162   ° °Agencies that provide inexpensive medical  care: °Organization         Address  Phone   Notes  °[facility]  (336) 832-8035   °[facility]    (336) 832-7272   °[facility] [address] °[place], [place] [address] (336) 832-4777   °[facility] [address], °[place] (336) 271-4999   °[facility]    (336) 373-0678   °[facility]    (336) 272-1050   °[facility] ° [address], [place] Phone:  (336) 832-4444, Fax:  (336) 832-4440 Hours of Operation:  9 am - 6 pm, M-F.  Also accepts Medicaid/Medicare and self-pay.  °[facility] ° [address], [place] Phone: (336) 832-3150, Fax: (336) [phone]. Hours of Operation:  8:30 am - 5:30 pm, M-F.  Also accepts Medicaid and self-pay.  °[facility] [place] [address], [place] Phone: (336) 878-6027   °[facility] [address], [place], [place] (336)723-1848, Ext. 123 Mondays & Thursdays: 7-9 AM.  First 15 patients are seen on a first come, first serve basis. °  ° °Medicaid-accepting [place] Providers: ° °Organization         Address  Phone   Notes  °[facility] [address], [place] (336) [phone] Also accepts self-pay patients.  °[facility] [address], [place] ° (336) 856-9996   °[facility] [address], [place] (336) 288-8857   °[facility] [address] [place] [address], [place] (336) 299-7000   °Veita Bland [address], [place]  ° (336) 373-1557 Only accepts  Access Medicaid patients after they have their name applied to their card.  ° °Self-Pay (no insurance) in [place]: ° °Organization         Address  Phone   Notes  °Sickle Cell Patients, [facility] [address], [place] (336) 832-1970   °[facility] [address],  [place] (336) 832-4400   °[facility] ° [address], [place] (336) 992-4800   °[facility]/Dr. Osei-Bonsu ° [address] [place] [address], [place] or [address], [place] (336) [phone] Phone number for both [place] and [place] locations is the same.  °[facility] [address], [place] (336) 299-0000   °[facility] [address] [place] [address], [place] or [address] (336) 852-7530 °(336) 878-2260   °[facility] [address], [place] (336) 350-1642, phone; (336) 294-5005, fax Sees patients 1st and 3rd Saturday of every month.  Must not qualify for public or private insurance (i.e. Medicaid, Medicare, [place] Health Choice, Veterans' Benefits) • Household income should be no more than 200% of the poverty level •The clinic cannot treat you if you are pregnant or think you are pregnant • Sexually transmitted diseases are not treated at the clinic.  ° ° °Dental Care: °Organization         Address  Phone  Notes  °[place] [facility] [address], [place] (336) 641-6152 Accepts children up to age 21 who are enrolled in Medicaid or [place] Health Choice; pregnant women with a Medicaid card; and children who have applied for Medicaid or [place] Health Choice, but were declined, whose parents can pay a reduced fee at time of service.  °[place] [facility] [place]  [address], [place] (336) 641-7733 Accepts children up to age 21 who are enrolled in Medicaid or [place] Health Choice; pregnant women with a Medicaid card; and children who have applied for Medicaid or [place] Health Choice, but were declined, whose parents can pay a reduced fee at time of service.  °[facility] ° [address], [place] (336) 641-4533 Patients are seen by appointment only. Walk-ins are not accepted. [facility] will see patients 18 years of age and older. °Monday - Tuesday (8am-5pm) °Most Wednesdays (8:30-5pm) °$30 per visit, cash only  °[facility] ° [address], [place] (336) 641-4533 Patients are seen by appointment only. Walk-ins are not accepted. [facility] will see patients 18 years of age and older. °One Wednesday Evening (Monthly: Volunteer Based).  $30 per visit, cash only  °[facility]  (919) 537-3737 for adults; Children under age 4, call Graduate Pediatric Dentistry at ([phone]) [phone]. Children aged 4-14, please call ([phone]) [phone] to request a pediatric application. ° Dental services are provided in all areas of dental care including fillings, crowns and bridges, complete and partial dentures, implants, gum treatment, root canals, and extractions. Preventive care is also provided. Treatment is provided to both adults and children. °Patients are selected via a lottery and there is often a waiting list. °  °[facility] [address], °[place] ° (336) 763-8833 [email or web address] °  °[facility] [address], [place], [place] (336)723-1848, Ext. 123 Second and Fourth Thursday of each month, opens at 6:30 AM; Clinic ends at 9 AM.  Patients are seen on a first-come first-served basis, and a limited number are seen during each clinic.  ° °[facility] ° [address], [place], [place] (336) 723-7904   Eligibility Requirements °You must have lived in [place], [place], or [place] counties for at least the last three months. °  You cannot be eligible for state or federal sponsored healthcare insurance, including Veterans Administration, Medicaid, or Medicare. °  You generally cannot be eligible for healthcare insurance through your employer.  °  How to apply: °Eligibility screenings are held every Tuesday and Wednesday afternoon from 1:00 pm until 4:00 pm. You do not need an appointment for the interview!   °[facility] [address], [place], [place] [phone]   °[place]   [facility]  [phone]   [facility]  [phone]   [facility]  [phone]    Behavioral Health Resources in the Community: Intensive Outpatient Programs Organization         Address  Phone  Notes  [facility] [address], [place], [place] [phone]   [facility] [address], [place], [place] [phone]   [facility] [address], [place], [place]  [phone]   [facility] [address],  [place], [place] [phone] or [phone]   Substance Abuse Resources Organization         Address  Phone  Notes  [facility]  [phone]   [facility]  [phone]   [facility]  (316) [phone]   [facility]  [phone]   Residential & Outpatient Substance Abuse Program  [phone]   Psychological Services Organization         Address  Phone  Notes  [facility]  [phone]- [phone]   [facility]  [phone]- [phone]   [facility] [address], [place] [phone] or [phone]    Mobile Crisis Teams Organization         Address  Phone  Notes  [facility], Mobile Crisis Care Unit  [phone]   [facility]  [address] [place], [place] [phone]   Doristine LocksSharon DeEsch [address] [place] [phone]    Self-Help/Support Groups Organization         Address  Phone             Notes  [facility] [place] - variety of support groups  336- I7437963([phone] Call for more information  Narcotics Anonymous (NA), [facility] [address], [place]  2 meetings at this location   [facility]ian[facility]idential Treatment Programs Organization         Address  Phone  Notes  [facility] [address],    [place]  [phone]   [facility]  [address], [place] [phone], [place], [place] [phone]   [facility] [address], [place] [phone] Admissions: 8am-3pm M-F  [facility] [address]    [place], [place] [phone]   [facility] [address], [place], [place] [phone]   [facility] [address]  [place], [place] [phone]   [facility] Intensive Outpatient [address], [place], [place] [phone]   [facility] ([facility] [address]  [place], [place] [phone] or [phone]   [facility] ([facility]) [address] [place], [place] [phone] Accepts Medicaid  [facility] [address]  [place] [phone] Substance Abuse/Addiction Treatment   [facility] Resources Organization         Address  Phone  Notes  [facility]  [phone]) [phone]   Angie FavaJulie Brannon, PhD [address], [address] [place], [place]   [phone] or [phone](336) [phone]   [facility]   [address] [place], [place] [phone]   [facility] [address], [place], [place] [phone]) [phone] Insurance/Medicaid/sponsorship through [facility] [address]  [place], [place] [phone] [place], [place] [phone]    Dr. Adele Schilder  [phone]   [facility] Dept. 1) [address], [place] 2) [place] 3)  [address], [place] [phone]  [phone]   [place] [phone] or [phone] (After Hours)

## 2014-02-26 NOTE — ED Notes (Signed)
Pt. reports progressing abscess at right (antecubital) arm with drainage onset last Saturday .

## 2014-02-28 ENCOUNTER — Encounter (HOSPITAL_COMMUNITY): Payer: Self-pay | Admitting: Emergency Medicine

## 2014-02-28 ENCOUNTER — Emergency Department (INDEPENDENT_AMBULATORY_CARE_PROVIDER_SITE_OTHER)
Admission: EM | Admit: 2014-02-28 | Discharge: 2014-02-28 | Disposition: A | Discharge status: Home / Self Care | Payer: Self-pay | Source: Home / Self Care | Attending: Family Medicine | Admitting: Family Medicine

## 2014-02-28 DIAGNOSIS — Z09 Encounter for follow-up examination after completed treatment for conditions other than malignant neoplasm: Secondary | ICD-10-CM

## 2014-02-28 DIAGNOSIS — Z4800 Encounter for change or removal of nonsurgical wound dressing: Secondary | ICD-10-CM

## 2014-02-28 NOTE — ED Notes (Signed)
Assessment per Dr. Kindl. 

## 2014-02-28 NOTE — ED Provider Notes (Signed)
CSN: [id number]     Arrival date & time 02/28/14  1811 History   First MD Initiated Contact with Patient 02/28/14 1856     Chief Complaint  Patient presents with  . Follow-up   (Consider location/radiation/quality/duration/timing/severity/associated sxs/prior Treatment) Patient is a [age] y.o. male presenting with abscess.  Abscess Location:  Shoulder/arm Shoulder/arm abscess location:  R forearm Abscess quality: draining and painful   Red streaking: no   Progression:  Improving Pain details:    Quality:  No pain   Severity:  No pain   Progression:  Improving Chronicity:  New Associated symptoms: no fever     History reviewed. No pertinent past medical history. Past Surgical History  Procedure Laterality Date  . Foot surgery Left    No family history on file. History  Substance Use Topics  . Smoking status: Current Some Day Smoker    Types: Cigarettes  . Smokeless tobacco: Not on file  . Alcohol Use: Yes     Comment: not regularly    Review of Systems  Constitutional: Negative.  Negative for fever.  Skin: Positive for wound.    Allergies  Bee venom  Home Medications   Prior to Admission medications   Medication Sig Start Date End Date Taking? Authorizing Provider  cephALEXin (KEFLEX) 500 MG capsule 2 caps po bid x 7 days 02/26/14  Yes Mercedes Strupp Camprubi-Soms, PA-C  oxyCODONE-acetaminophen (PERCOCET) 5-325 MG per tablet Take 1-2 tablets by mouth every 6 (six) hours as needed for severe pain. 02/26/14  Yes Mercedes Strupp Camprubi-Soms, PA-C  sulfamethoxazole-trimethoprim (BACTRIM DS) 800-160 MG per tablet Take 1 tablet by mouth 2 (two) times daily. 02/26/14  Yes Mercedes Strupp Camprubi-Soms, PA-C   BP 136/87  Pulse 81  Temp(Src) 98.3 F (36.8 C) (Oral)  Resp 18  SpO2 97% Physical Exam  Nursing note and vitals reviewed. Constitutional: He is oriented to person, place, and time. He appears well-developed and well-nourished.  Neurological: He is alert and  oriented to person, place, and time.  Skin: Skin is warm and dry.  Packing removed from forearm abscess, decreased erythema from marking edges. No drainage or pain    ED Course  Procedures (including critical care time) Labs Review Labs Reviewed - No data to display  Imaging Review No results found.   MDM   1. Encounter for recheck of abscess following incision and drainage        Linna HoffJames D Lexey Fletes, MD 02/28/14 1958

## 2014-02-28 NOTE — Discharge Instructions (Signed)
Warm soak and finish your antibiotic, return as needed bacitracin ointment 2-3 times a day.

## 2014-03-01 LAB — WOUND CULTURE

## 2014-03-02 ENCOUNTER — Telehealth (HOSPITAL_BASED_OUTPATIENT_CLINIC_OR_DEPARTMENT_OTHER): Payer: Self-pay | Admitting: Emergency Medicine

## 2014-03-02 NOTE — Telephone Encounter (Signed)
Post ED Visit - Positive Culture Follow-up  Culture report reviewed by antimicrobial stewardship pharmacist: [x]  Wes Dulaney, Pharm.D., BCPS $RemoveBefore EID_teeStw[email or web address]$[]evardPharm.D., BCPS []  Georgina PillionElizabeth Martin, Pharm.D., BCPS []  [place]onmoreMinh Pham, [address] BoulevardPharm.D., BCPS, AAHIVP []  Estella HuskMichelle Turner, Pharm.D., BCPS, AAHIVP  Positive wound culture Treated with Keflex, organism sensitive to the same and no further patient follow-up is required at this time.  Zeb ComfortHol[place], Marinna Blane 03/02/2014, 5:45 PM

## 2014-03-08 NOTE — ED Provider Notes (Signed)
Medical screening examination/treatment/procedure(s) were performed by non-physician practitioner and as supervising physician I was immediately available for consultation/collaboration.   EKG Interpretation None        Shamaria Kavan, MD 03/08/14 0758 

## 2014-06-26 ENCOUNTER — Emergency Department (HOSPITAL_COMMUNITY)
Admission: EM | Admit: 2014-06-26 | Discharge: 2014-06-27 | Disposition: A | Discharge status: Home / Self Care | Payer: Self-pay | Attending: Emergency Medicine | Admitting: Emergency Medicine

## 2014-06-26 ENCOUNTER — Encounter (HOSPITAL_COMMUNITY): Payer: Self-pay

## 2014-06-26 DIAGNOSIS — R5383 Other fatigue: Secondary | ICD-10-CM | POA: Insufficient documentation

## 2014-06-26 DIAGNOSIS — T50901A Poisoning by unspecified drugs, medicaments and biological substances, accidental (unintentional), initial encounter: Secondary | ICD-10-CM

## 2014-06-26 DIAGNOSIS — R000 Tachycardia, unspecified: Secondary | ICD-10-CM | POA: Insufficient documentation

## 2014-06-26 DIAGNOSIS — Z720 Tobacco use: Secondary | ICD-10-CM | POA: Insufficient documentation

## 2014-06-26 DIAGNOSIS — R61 Generalized hyperhidrosis: Secondary | ICD-10-CM | POA: Insufficient documentation

## 2014-06-26 DIAGNOSIS — T401X1A Poisoning by heroin, accidental (unintentional), initial encounter: Secondary | ICD-10-CM | POA: Insufficient documentation

## 2014-06-26 DIAGNOSIS — Y9289 Other specified places as the place of occurrence of the external cause: Secondary | ICD-10-CM | POA: Insufficient documentation

## 2014-06-26 DIAGNOSIS — R0682 Tachypnea, not elsewhere classified: Secondary | ICD-10-CM | POA: Insufficient documentation

## 2014-06-26 DIAGNOSIS — Z792 Long term (current) use of antibiotics: Secondary | ICD-10-CM | POA: Insufficient documentation

## 2014-06-26 DIAGNOSIS — Y9389 Activity, other specified: Secondary | ICD-10-CM | POA: Insufficient documentation

## 2014-06-26 MED ORDER — SODIUM CHLORIDE 0.9 % IV BOLUS (SEPSIS): 1000.0000 mL | Freq: Once | INTRAVENOUS | Status: DC

## 2014-06-26 NOTE — ED Notes (Signed)
Pt placed in a gown and hooked up to the cardiac monitor. Pt also hooked up to the BP cuff and pulse ox

## 2014-06-26 NOTE — ED Provider Notes (Signed)
CSN: [id number]     Arrival date & time 06/26/14  2210 History   First MD Initiated Contact with Patient 06/26/14 2216     Chief Complaint  Patient presents with  . Drug Overdose     HPI  Patient presents after probable heroin overdose. Patient denies other ingestions. Patient states that approximately 2-1/2 hours ago patient injected heroin.  Subsequently there was a period of amnesia, the patient awoke with EMS providers on the scene. Patient denies awakening with confusion, disorientation, chest pain. Currently the patient feels only slight fatigue, other issues have resolved. Per EMS the patient was diaphoretic, hypotensive, tachycardic on their arrival. Patient was awakening. Patient denies other medical problems, states that he has previously had assistance with heroin abuse.   History reviewed. No pertinent past medical history. Past Surgical History  Procedure Laterality Date  . Foot surgery Left    History reviewed. No pertinent family history. History  Substance Use Topics  . Smoking status: Current Some Day Smoker    Types: Cigarettes  . Smokeless tobacco: Not on file  . Alcohol Use: Yes     Comment: not regularly    Review of Systems  All other systems reviewed and are negative.     Allergies  Bee venom  Home Medications   Prior to Admission medications   Medication Sig Start Date End Date Taking? Authorizing Provider  cephALEXin (KEFLEX) 500 MG capsule 2 caps po bid x 7 days 02/26/14   Mercedes Strupp Camprubi-Soms, PA-C  oxyCODONE-acetaminophen (PERCOCET) 5-325 MG per tablet Take 1-2 tablets by mouth every 6 (six) hours as needed for severe pain. 02/26/14   Mercedes Strupp Camprubi-Soms, PA-C  sulfamethoxazole-trimethoprim (BACTRIM DS) 800-160 MG per tablet Take 1 tablet by mouth 2 (two) times daily. 02/26/14   Mercedes Strupp Camprubi-Soms, PA-C   BP 145/74 mmHg  Pulse 96  Temp(Src) 98.4 F (36.9 C) (Oral)  Resp 25  SpO2 98% Physical Exam   Constitutional: He is oriented to person, place, and time. He appears well-developed. No distress.  HENT:  Head: Normocephalic and atraumatic.  Eyes: Conjunctivae and EOM are normal.  Cardiovascular: Regular rhythm.  Tachycardia present.   Pulmonary/Chest: Effort normal. No stridor. Tachypnea noted. No respiratory distress.  Abdominal: He exhibits no distension.  Musculoskeletal: He exhibits no edema.  Neurological: He is alert and oriented to person, place, and time.  Skin: Skin is warm. He is diaphoretic.  The dorsum of each hand has several injection marks, though none with active bleeding, discharge, spreading erythema  Psychiatric: He has a normal mood and affect.  Nursing note and vitals reviewed.   ED Course  Procedures (including critical care time) O2- 99%ra, nml  Cardiac: 95 sr, nml  Update: on repeat exam the patient is awake and alert, vital signs remained normal. Discharged in stable condition with assistance for heroin addiction.  MDM   Patient presents after a likely heroin overdose.  By the time of my evaluation, patient is awake and alert, hemodynamically stable.  Patient did not require Narcan. Patient's vital signs were stable, he has no new complaints, and after monitoring, he was discharged in stable condition with resources to assist with his heroin cessation efforts.   Gerhard Munchobert Takao Lizer, MD 06/26/14 [phone]

## 2014-06-26 NOTE — ED Notes (Signed)
Pt provided with Sprite

## 2014-06-26 NOTE — Discharge Instructions (Signed)
Emergency Department Resource Guide 1) Find a Doctor and Pay Out of Pocket Although you won't have to find out who is covered by your insurance plan, it is a good idea to ask around and get recommendations. You will then need to call the office and see if the doctor you have chosen will accept you as a new patient and what types of options they offer for patients who are self-pay. Some doctors offer discounts or will set up payment plans for their patients who do not have insurance, but you will need to ask so you aren't surprised when you get to your appointment.  2) Contact Your Local Health Department Not all health departments have doctors that can see patients for sick visits, but many do, so it is worth a call to see if yours does. If you don't know where your local health department is, you can check in your phone book. The CDC also has a tool to help you locate your state's health department, and many state websites also have listings of all of their local health departments.  3) Find a [facility] If your illness is not likely to be very severe or complicated, you may want to try a walk in clinic. These are popping up all over the country in pharmacies, drugstores, and shopping centers. They're usually staffed by nurse practitioners or physician assistants that have been trained to treat common illnesses and complaints. They're usually fairly quick and inexpensive. However, if you have serious medical issues or chronic medical problems, these are probably not your best option.  No Primary Care Doctor: - Call Health Connect at  [phone] - they can help you locate a primary care doctor that  accepts your insurance, provides certain services, etc. - Physician Referral Service- [phone]  Chronic Pain Problems: Organization         Address  Phone   Notes  [facility]  [phone] Patients need to be referred by their primary care doctor.   Medication  Assistance: Organization         Address  Phone   Notes  [facility] Medication [facility] [address] [place], [place] [address] [phone] --Must be a resident of [facility] -- Must have NO insurance coverage whatsoever (no Medicaid/ Medicare, etc.) -- The pt. MUST have a primary care doctor that directs their care regularly and follows them in the community   [facility]  [phone]   [employer]  [phone]    Agencies that provide inexpensive medical care: Organization         Address  Phone   Notes  [place]  [phone]   [facility]    [phone]   [facility] [place], [place] [address] [phone]   [place] [address] [place] [phone]   [facility]    [phone]   [facility]    [phone]   [place] [address], [place] Phone:  [phone], Fax:  [phone] Hours of Operation:  9 am - 6 pm, M-F.  Also accepts Medicaid/Medicare and self-pay.  [facility] for [address], [address], [place] Phone: [phone], Fax: [phone]. Hours of Operation:  8:30 am - 5:30 pm, M-F.  Also accepts Medicaid and self-pay.  [facility] [place] [address], [place] Phone: [phone]   [facility] [address], [place], [place] [phone], Ext. [phone] Mondays & Thursdays: 7-9 AM.  First 15 patients are seen on a first come, first serve basis.    Medicaid-accepting [facility] Providers:  Organization         Address  Phone   Notes  [facility] [address], [place] [phone] Also accepts self-pay patients.  [facility] [address], [place]  [phone]   [facility] [address], [place]  [phone]   [facility] [address] [place] [address], [place] [phone]   Renaye RakersVeita Bland [address], [place]   [phone] Only accepts [place] patients after they have their name applied to their card.   Self-Pay (no insurance) in [facility]:  Organization         Address  Phone   Notes  Sickle Cell Patients, [facility] [address], [place] [phone]   [facility] [address], [place] [phone]   [facility] [place]  [address], [place] [phone]   [facility]/Dr. Osei-Bonsu  [address] [place] [address], [place] or [phone], [place] [phone] Phone number for both [place] and [place] locations is the same.  [facility] [address], [place] [phone]   [facility] [address] [place] [address], [place] or [address] [phone]   [facility] [address], [place] [phone], phone; [phone]) [phone], fax Sees patients 1st and 3rd Saturday of every month.  Must not qualify for public or private insurance (i.e. Medicaid, Medicare, [place] Health Choice, Veterans' Benefits)  Household income should be no more than 200% of the poverty level The clinic cannot treat you if you are pregnant or think you are pregnant  Sexually transmitted diseases are not treated at the clinic.    Behavioral Health Resources in the Community: Intensive Outpatient Programs Organization         Address  Phone  Notes  [facility] [address], [place], [place] [phone]   [facility] [address], [place], [place] [phone]   [facility] [address], [place], [place]  [phone]   [facility] [address],  [place], [place] [phone] or  [phone]   Substance Abuse Resources Organization         Address  Phone  Notes  [facility]  [phone]   [facility]  [phone]   [facility]  [phone]   [facility]  [phone]   Residential & Outpatient Substance Abuse Program  [phone]   Psychological Services Organization         Address  Phone  Notes  [facility]  [phone]   [facility]  [phone]   [facility] [address], [place] [phone] or [phone]      Residential Treatment Programs Organization         Address  Phone  Notes  [facility] [address],    [place]  [phone]   [facility]  [phone], [place], [place] [phone]   [facility] [address], [place] [phone] Admissions: 8am-3pm M-F  [facility] [address]    [place], [place] [phone]   [facility] [address], [place], [place] [phone]   [facility] [address]  [place], [place] [phone]   [facility] - Intensive Outpatient [address], [place], [place] [phone]   [facility] [address]  [place], [place] [phone] or [phone]   [facility] ([facility]) [address] [place], [place] [phone] Accepts Medicaid  [facility] [address]  [place] [phone] Substance Abuse/Addiction Treatment

## 2014-06-26 NOTE — ED Notes (Signed)
Unable to obtain IV on pt, Dr. Jeraldine LootsLockwood states pt may consume oral liquids instead.

## 2014-06-26 NOTE — ED Notes (Signed)
Per EMS: pt reports doing 20 dollars worth of heroin between 830-9.  Sts he remembers mixing drugs and shooting it in his hand; became unresponsive.  Pt was coming around when EMS arrived.  Pt CAO x 4.  No narcan given.  Initially tachycardic at 140 and hypertensive at 187/109.  VSS on arrival.  Pt reports overdoses in the past; sts he talks to a therapist regarding drug problem.  Sts he wants to handle this on his own.  No SI, HI reported.

## 2014-07-03 ENCOUNTER — Inpatient Hospital Stay (HOSPITAL_COMMUNITY)
Admission: EM | Admit: 2014-07-03 | Discharge: 2014-07-05 | DRG: 580 | Disposition: A | Discharge status: Home / Self Care | Payer: Self-pay | Attending: Internal Medicine | Admitting: Internal Medicine

## 2014-07-03 ENCOUNTER — Encounter (HOSPITAL_COMMUNITY): Payer: Self-pay | Admitting: *Deleted

## 2014-07-03 DIAGNOSIS — F17210 Nicotine dependence, cigarettes, uncomplicated: Secondary | ICD-10-CM | POA: Diagnosis present

## 2014-07-03 DIAGNOSIS — F1110 Opioid abuse, uncomplicated: Secondary | ICD-10-CM | POA: Diagnosis present

## 2014-07-03 DIAGNOSIS — L02512 Cutaneous abscess of left hand: Secondary | ICD-10-CM | POA: Diagnosis present

## 2014-07-03 DIAGNOSIS — T401X2A Poisoning by heroin, intentional self-harm, initial encounter: Secondary | ICD-10-CM | POA: Diagnosis present

## 2014-07-03 DIAGNOSIS — Z79899 Other long term (current) drug therapy: Secondary | ICD-10-CM

## 2014-07-03 DIAGNOSIS — M7989 Other specified soft tissue disorders: Secondary | ICD-10-CM | POA: Diagnosis present

## 2014-07-03 DIAGNOSIS — Z79891 Long term (current) use of opiate analgesic: Secondary | ICD-10-CM

## 2014-07-03 DIAGNOSIS — Z791 Long term (current) use of non-steroidal anti-inflammatories (NSAID): Secondary | ICD-10-CM

## 2014-07-03 DIAGNOSIS — F1910 Other psychoactive substance abuse, uncomplicated: Secondary | ICD-10-CM | POA: Diagnosis present

## 2014-07-03 DIAGNOSIS — L03114 Cellulitis of left upper limb: Principal | ICD-10-CM | POA: Diagnosis present

## 2014-07-03 DIAGNOSIS — L0390 Cellulitis, unspecified: Secondary | ICD-10-CM | POA: Diagnosis present

## 2014-07-03 DIAGNOSIS — F1920 Other psychoactive substance dependence, uncomplicated: Secondary | ICD-10-CM | POA: Diagnosis present

## 2014-07-03 LAB — COMPREHENSIVE METABOLIC PANEL
ALBUMIN: 4.1 g/dL (ref 3.5–5.2)
ALK PHOS: 56 U/L (ref 39–117)
ALT: 30 U/L (ref 0–53)
ANION GAP: 17 — AB (ref 5–15)
AST: 18 U/L (ref 0–37)
BILIRUBIN TOTAL: 0.7 mg/dL (ref 0.3–1.2)
BUN: 13 mg/dL (ref 6–23)
CHLORIDE: 104 meq/L (ref 96–112)
CO2: 21 meq/L (ref 19–32)
Calcium: 9.6 mg/dL (ref 8.4–10.5)
Creatinine, Ser: 0.83 mg/dL (ref 0.50–1.35)
GFR calc Af Amer: >90 mL/min (ref >90)
GFR calc non Af Amer: >90 mL/min (ref >90)
Glucose, Bld: 113 mg/dL — ABNORMAL HIGH (ref 70–99)
POTASSIUM: 3.9 meq/L (ref 3.7–5.3)
Sodium: 142 mEq/L (ref 137–147)
Total Protein: 7.7 g/dL (ref 6.0–8.3)

## 2014-07-03 LAB — CBC WITH DIFFERENTIAL/PLATELET
Basophils Absolute: 0 10*3/uL (ref 0.0–0.1)
Basophils Relative: 0 % (ref 0–1)
Eosinophils Absolute: 0.5 10*3/uL (ref 0.0–0.7)
Eosinophils Relative: 3 % (ref 0–5)
HCT: 45.9 % (ref 39.0–52.0)
HEMOGLOBIN: 15.4 g/dL (ref 13.0–17.0)
LYMPHS ABS: 2.7 10*3/uL (ref 0.7–4.0)
LYMPHS PCT: 15 % (ref 12–46)
MCH: 28.9 pg (ref 26.0–34.0)
MCHC: 33.6 g/dL (ref 30.0–36.0)
MCV: 86.1 fL (ref 78.0–100.0)
MONOS PCT: 9 % (ref 3–12)
Monocytes Absolute: 1.6 10*3/uL — ABNORMAL HIGH (ref 0.1–1.0)
NEUTROS ABS: 12.5 10*3/uL — AB (ref 1.7–7.7)
NEUTROS PCT: 73 % (ref 43–77)
Platelets: 269 10*3/uL (ref 150–400)
RBC: 5.33 MIL/uL (ref 4.22–5.81)
RDW: 13.7 % (ref 11.5–15.5)
WBC: 17.3 10*3/uL — AB (ref 4.0–10.5)

## 2014-07-03 LAB — CBC
HCT: 42.9 % (ref 39.0–52.0)
Hemoglobin: 14 g/dL (ref 13.0–17.0)
MCH: 28.4 pg (ref 26.0–34.0)
MCHC: 32.6 g/dL (ref 30.0–36.0)
MCV: 87 fL (ref 78.0–100.0)
Platelets: 239 10*3/uL (ref 150–400)
RBC: 4.93 MIL/uL (ref 4.22–5.81)
RDW: 13.8 % (ref 11.5–15.5)
WBC: 13 10*3/uL — ABNORMAL HIGH (ref 4.0–10.5)

## 2014-07-03 LAB — C-REACTIVE PROTEIN: CRP: 6.5 mg/dL — ABNORMAL HIGH (ref <0.60)

## 2014-07-03 LAB — HEPATITIS PANEL, ACUTE
HCV Ab: NEGATIVE
HEP A IGM: NONREACTIVE
HEP B C IGM: NONREACTIVE
HEP B S AG: NEGATIVE

## 2014-07-03 LAB — SEDIMENTATION RATE: Sed Rate: 25 mm/hr — ABNORMAL HIGH (ref 0–16)

## 2014-07-03 LAB — CK: Total CK: 113 U/L (ref 7–232)

## 2014-07-03 LAB — HIV ANTIBODY (ROUTINE TESTING W REFLEX): HIV: NONREACTIVE

## 2014-07-03 MED ORDER — ACETAMINOPHEN 325 MG PO TABS
650.0000 mg | ORAL_TABLET | Freq: Four times a day (QID) | ORAL | Status: DC | PRN
  Administered 2014-07-04: 650 mg via ORAL
  Filled 2014-07-03: qty 2

## 2014-07-03 MED ORDER — ACETAMINOPHEN 650 MG RE SUPP: 650.0000 mg | Freq: Four times a day (QID) | RECTAL | Status: DC | PRN

## 2014-07-03 MED ORDER — VANCOMYCIN HCL 10 G IV SOLR
1500.0000 mg | Freq: Once | INTRAVENOUS | Status: AC
  Administered 2014-07-03: 1500 mg via INTRAVENOUS
  Filled 2014-07-03: qty 1500

## 2014-07-03 MED ORDER — VANCOMYCIN HCL 10 G IV SOLR
1500.0000 mg | Freq: Two times a day (BID) | INTRAVENOUS | Status: DC
  Administered 2014-07-03 – 2014-07-05 (×4): 1500 mg via INTRAVENOUS
  Filled 2014-07-03 (×4): qty 1500

## 2014-07-03 MED ORDER — ENOXAPARIN SODIUM 80 MG/0.8ML ~~LOC~~ SOLN
70.0000 mg | SUBCUTANEOUS | Status: DC
  Administered 2014-07-04 – 2014-07-05 (×2): 70 mg via SUBCUTANEOUS
  Filled 2014-07-03 (×2): qty 0.8

## 2014-07-03 MED ORDER — LIDOCAINE HCL (PF) 1 % IJ SOLN
30.0000 mL | Freq: Once | INTRAMUSCULAR | Status: AC
  Administered 2014-07-03: 30 mL
  Filled 2014-07-03: qty 30

## 2014-07-03 MED ORDER — VANCOMYCIN HCL IN DEXTROSE 1-5 GM/200ML-% IV SOLN
1000.0000 mg | Freq: Once | INTRAVENOUS | Status: AC
  Administered 2014-07-03: 1000 mg via INTRAVENOUS
  Filled 2014-07-03: qty 200

## 2014-07-03 MED ORDER — NICOTINE 21 MG/24HR TD PT24
21.0000 mg | MEDICATED_PATCH | Freq: Every day | TRANSDERMAL | Status: DC
  Administered 2014-07-03 – 2014-07-05 (×3): 21 mg via TRANSDERMAL
  Filled 2014-07-03 (×3): qty 1

## 2014-07-03 MED ORDER — ONDANSETRON HCL 4 MG/2ML IJ SOLN: 4.0000 mg | Freq: Four times a day (QID) | INTRAMUSCULAR | Status: DC | PRN

## 2014-07-03 MED ORDER — FAMOTIDINE 20 MG PO TABS
20.0000 mg | ORAL_TABLET | Freq: Every day | ORAL | Status: DC
  Administered 2014-07-03 – 2014-07-05 (×3): 20 mg via ORAL
  Filled 2014-07-03 (×3): qty 1

## 2014-07-03 MED ORDER — SODIUM CHLORIDE 0.9 % IV SOLN
INTRAVENOUS | Status: DC
  Administered 2014-07-03 (×2): via INTRAVENOUS

## 2014-07-03 MED ORDER — SODIUM CHLORIDE 0.9 % IV SOLN
INTRAVENOUS | Status: DC
  Administered 2014-07-03: 05:00:00 via INTRAVENOUS

## 2014-07-03 MED ORDER — ONDANSETRON HCL 4 MG PO TABS: 4.0000 mg | ORAL_TABLET | Freq: Four times a day (QID) | ORAL | Status: DC | PRN

## 2014-07-03 MED ORDER — ENOXAPARIN SODIUM 40 MG/0.4ML ~~LOC~~ SOLN
40.0000 mg | SUBCUTANEOUS | Status: DC
  Administered 2014-07-03: 40 mg via SUBCUTANEOUS
  Filled 2014-07-03: qty 0.4

## 2014-07-03 MED ORDER — PIPERACILLIN-TAZOBACTAM 3.375 G IVPB
3.3750 g | Freq: Three times a day (TID) | INTRAVENOUS | Status: DC
  Administered 2014-07-03 – 2014-07-05 (×7): 3.375 g via INTRAVENOUS
  Filled 2014-07-03 (×10): qty 50

## 2014-07-03 MED ORDER — NAPROXEN 500 MG PO TABS
500.0000 mg | ORAL_TABLET | Freq: Two times a day (BID) | ORAL | Status: DC
  Administered 2014-07-03 – 2014-07-05 (×5): 500 mg via ORAL
  Filled 2014-07-03 (×8): qty 1

## 2014-07-03 NOTE — ED Provider Notes (Signed)
CSN: [id number]     Arrival date & time 07/03/14  0032 History   First MD Initiated Contact with Patient 07/03/14 0253     Chief Complaint  Patient presents with  . Abscess  . Cellulitis     (Consider location/radiation/quality/duration/timing/severity/associated sxs/prior Treatment) HPI Patient was seen a week ago for heroin overdose. States that the time he unsuccessfully attempted to inject the pain in left hand. Since that time he's had increased swelling to the dorsum of the left hand with pain and decreased range of motion. Has developed abscess, fever, chills over the past 2 days. Is not spontaneously draining. Denies any nausea or vomiting. States he has noticed the redness tracking up his arm. History reviewed. No pertinent past medical history. Past Surgical History  Procedure Laterality Date  . Foot surgery Left    History reviewed. No pertinent family history. History  Substance Use Topics  . Smoking status: Current Some Day Smoker    Types: Cigarettes  . Smokeless tobacco: Not on file  . Alcohol Use: Yes     Comment: not regularly    Review of Systems  Constitutional: Positive for fever and chills.  Respiratory: Negative for cough and shortness of breath.   Cardiovascular: Negative for chest pain.  Gastrointestinal: Negative for nausea, vomiting and abdominal pain.  Musculoskeletal: Negative for back pain, neck pain and neck stiffness.  Skin: Positive for color change and rash. Negative for wound.  Neurological: Negative for dizziness, weakness, light-headedness, numbness and headaches.  All other systems reviewed and are negative.     Allergies  Bee venom  Home Medications   Prior to Admission medications   Medication Sig Start Date End Date Taking? Authorizing Provider  ibuprofen (ADVIL,MOTRIN) 200 MG tablet Take 600 mg by mouth every 6 (six) hours as needed for moderate pain.   Yes Historical Provider, MD  cephALEXin (KEFLEX) 500 MG capsule 2 caps po  bid x 7 days Patient not taking: Reported on 06/26/2014 02/26/14   Mercedes Strupp Camprubi-Soms, PA-C  oxyCODONE-acetaminophen (PERCOCET) 5-325 MG per tablet Take 1-2 tablets by mouth every 6 (six) hours as needed for severe pain. Patient not taking: Reported on 06/26/2014 02/26/14   Donnita FallsMercedes Strupp Camprubi-Soms, PA-C  sulfamethoxazole-trimethoprim (BACTRIM DS) 800-160 MG per tablet Take 1 tablet by mouth 2 (two) times daily. Patient not taking: Reported on 06/26/2014 02/26/14   Donnita [place]cedes Strupp Camprubi-Soms, PA-C   BP 150/73 mmHg  Pulse 93  Temp(Src) 100.9 F (38.3 C) (Oral)  Resp 26  Ht 6\' 2"  (1.88 m)  Wt 315 lb (142.883 kg)  BMI 40.43 kg/m2  SpO2 98% Physical Exam  Constitutional: He is oriented to person, place, and time. He appears well-developed and well-nourished. No distress.  HENT:  Head: Normocephalic and atraumatic.  Mouth/Throat: Oropharynx is clear and moist.  Eyes: EOM are normal. Pupils are equal, round, and reactive to light.  Neck: Normal range of motion. Neck supple.  Cardiovascular: Normal rate and regular rhythm.   Pulmonary/Chest: Effort normal and breath sounds normal. No respiratory distress. He has no wheezes. He has no rales.  Abdominal: Soft. Bowel sounds are normal.  Musculoskeletal: Normal range of motion. He exhibits edema. He exhibits no tenderness.  Large abscess to the dorsum of the left hand. There is surrounding edema and cellulitis. Cellulitis tracks to the mid forearm. Warm to touch. Patient with decreased range of motion of the left hand  Neurological: He is alert and oriented to person, place, and time.  Sensation intact.  Skin:  Skin is warm and dry. No rash noted. There is erythema.  Psychiatric: He has a normal mood and affect. His behavior is normal.  Nursing note and vitals reviewed.   ED Course  INCISION AND DRAINAGE Date/Time: 07/03/2014 4:10 AM Performed by: Loren RacerYELVERTON, Dolly Harbach Authorized by: Ranae PalmsYELVERTON, Addysin Porco Patient identity confirmed:  verbally with patient Type: abscess Body area: upper extremity Location details: left hand Local anesthetic: lidocaine 1% without epinephrine Anesthetic total: 2 ml Patient sedated: no Risk factor: underlying major vessel Scalpel size: 11 Incision type: single straight Complexity: simple Drainage: purulent Drainage amount: copious Wound treatment: wound left open Packing material: none Patient tolerance: Patient tolerated the procedure well with no immediate complications   (including critical care time) Labs Review Labs Reviewed  CBC WITH DIFFERENTIAL - Abnormal; Notable for the following:    WBC 17.3 (*)    Neutro Abs 12.5 (*)    Monocytes Absolute 1.6 (*)    All other components within normal limits  COMPREHENSIVE METABOLIC PANEL - Abnormal; Notable for the following:    Glucose, Bld 113 (*)    Anion gap 17 (*)    All other components within normal limits    Imaging Review No results found.   EKG Interpretation None      MDM   Final diagnoses:  Abscess of hand, left  Cellulitis of arm, left    Discussed with Dr. Izora Ribasoley.agrees that superficial attempts at I&D in the emergency department. We'll see the patient in the morning. Dr. Allena KatzPatel of [facility] will admit to MedSurg bed.  Superficial incision made of the abscess on the left hand. Copious amounts of foul-smelling purulent material expressed. Dressing placed.    Loren Raceravid Jahred Tatar, MD 07/03/14 [phone]

## 2014-07-03 NOTE — H&P (Signed)
[facility] History and Physical  Patient: David Beltran  DTO:[id number]  DOB: Mar 17, 1991  DOS: the patient was seen and examined on 07/03/2014 PCP: No PCP Per Patient  Chief Complaint: Left hand swelling  HPI: David Beltran is a 23 y.o. male with Past medical history of drug abuse and cellulitis. 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 He missed on the second time while injecting and had an injury with the needle. This was followed by all obtained showed drug overdose and he was seen in the ER. Later on he started having progressively worsening redness and pain as well as swelling of the left hand. He mentions increased to the point that he was unable to move the hand due to severe swelling and therefore he decided to come to the hospital. He complains of fever as well as chills. No chest pain or shortness of breath no diarrhea no burning urination.  The patient is coming from home And at his baseline independent for most of his ADL.  Review of Systems: as mentioned in the history of present illness.  A Comprehensive review of the other systems is negative.  History reviewed. No pertinent past medical history. Past Surgical History  Procedure Laterality Date  . Foot surgery Left    Social History:  reports that he has been smoking Cigarettes.  He has been smoking about 0.00 packs per day. He does not have any smokeless tobacco history on file. He reports that he drinks alcohol. He reports that he uses illicit drugs (IV).  Allergies  Allergen Reactions  . Bee Venom Swelling    "Childhood"    History reviewed. No pertinent family history.  Prior to Admission medications   Medication Sig Start Date End Date Taking? Authorizing Provider  ibuprofen (ADVIL,MOTRIN) 200 MG tablet Take 600 mg by mouth every 6  (six) hours as needed for moderate pain.   Yes Historical Provider, MD  cephALEXin (KEFLEX) 500 MG capsule 2 caps po bid x 7 days Patient not taking: Reported on 06/26/2014 02/26/14   Mercedes Strupp Camprubi-Soms, PA-C  oxyCODONE-acetaminophen (PERCOCET) 5-325 MG per tablet Take 1-2 tablets by mouth every 6 (six) hours as needed for severe pain. Patient not taking: Reported on 06/26/2014 02/26/14   Patty Sermons Camprubi-Soms, PA-C  sulfamethoxazole-trimethoprim (BACTRIM DS) 800-160 MG per tablet Take 1 tablet by mouth 2 (two) times daily. Patient not taking: Reported on 06/26/2014 02/26/14   Patty Sermons Camprubi-Soms, PA-C    Physical Exam: Filed Vitals:   07/03/14 0250 07/03/14 0300 07/03/14 0315 07/03/14 0443  BP: 141/71 134/68 150/73 146/76  Pulse: 91 84 93 96  Temp:    99.7 F (37.6 C)  TempSrc:      Resp: 26   20  Height:      Weight:      SpO2: 98% 96% 98% 98%    General: Alert, Awake and Oriented to Time, Place and Person. Appear in mild distress Eyes: PERRL ENT: Oral Mucosa clear moist. Neck: no JVD Cardiovascular: S1 and S2 Present, no Murmur, Peripheral Pulses Present Respiratory: Bilateral Air entry equal and Decreased, Clear to Auscultation, noCrackles, no wheezes Abdomen: Bowel Sound present, Soft and non tender Skin: no Rash Extremities: swelling of the left hand with decreased range of motion at the wrist and redness of the surrounding area, no  Pedal edema, no calf tenderness Neurologic: Grossly no focal neuro deficit.  Labs on Admission:  CBC:  Recent Labs Lab 07/03/14 0058  WBC 17.3*  NEUTROABS 12.5*  HGB 15.4  HCT 45.9  MCV 86.1  PLT 269    CMP     Component Value Date/Time   NA 142 07/03/2014 0058   K 3.9 07/03/2014 0058   CL 104 07/03/2014 0058   CO2 21 07/03/2014 0058   GLUCOSE 113* 07/03/2014 0058   BUN 13 07/03/2014 0058   CREATININE 0.83 07/03/2014 0058   CALCIUM 9.6 07/03/2014 0058   PROT 7.7 07/03/2014 0058   ALBUMIN 4.1  07/03/2014 0058   AST 18 07/03/2014 0058   ALT 30 07/03/2014 0058   ALKPHOS 56 07/03/2014 0058   BILITOT 0.7 07/03/2014 0058   GFRNONAA >90 07/03/2014 0058   GFRAA >90 07/03/2014 0058    No results for input(s): LIPASE, AMYLASE in the last 168 hours. No results for input(s): AMMONIA in the last 168 hours.  No results for input(s): CKTOTAL, CKMB, CKMBINDEX, TROPONINI in the last 168 hours. BNP (last 3 results) No results for input(s): PROBNP in the last 8760 hours.  Radiological Exams on Admission: No results found.  Assessment/Plan Principal Problem:   Abscess of left hand Active Problems:   Cellulitis   Drug abuse   1. Abscess of left hand The patient is presenting with complaints of left hand swelling. He has been abusing IV drugs and currently had a severe abscess. The abscess has been drained in the ER but the fluid has not been sent for culture by the ER physician. At present the patient is also due for consultation from hand surgery and we will follow the recommendation for further workup. Continue antibiotics with vancomycin and Zosyn. Although cultures and obtain ESR CRP as well as blood culture.  2.drug abuse. Trying to minimize use of narcotics and at present pain management with Tylenol and NSAIDs  Advance goals of care discussion: Full code   Consults: orthopedic hand surgery  DVT Prophylaxis: subcutaneous Heparin Nutrition: nothing by mouth except medication  Disposition: Admitted to inpatient in med-surge unit.  Author: Berle Mull, MD [facility] Pager: [phone] 07/03/2014, 5:20 AM    If 7PM-7AM, please contact night-coverage [email or web address] Password [email or web address]

## 2014-07-03 NOTE — Consult Note (Signed)
Reason for Consult:hand infection Referring Physician: ER  CC:My hand is infected  HPI:  David Beltran is an 23 y.o. right handed male who presents with swelling, pain of hand and forearm.  Pt reports using IV drugs, injecting in dorsal hand several days ago.  Hand has increasingly become red, hot, swollen, painful.  +f/c's.  Hand was gently I&D'd in ER last pm, decrease in edema, erythema, swelling this am. Pain is rated at    8/10 and is described as sharp.  Pain is constant.  Pain is made better by rest/immobilization, worse with motion.   Associated signs/symptoms: IV drugs  Previous treatment:    History reviewed. No pertinent past medical history.  Past Surgical History  Procedure Laterality Date  . Foot surgery Left     History reviewed. No pertinent family history.  Social History:  reports that he has been smoking Cigarettes.  He has been smoking about 0.00 packs per day. He does not have any smokeless tobacco history on file. He reports that he drinks alcohol. He reports that he uses illicit drugs (IV).  Allergies:  Allergies  Allergen Reactions  . Bee Venom Swelling    "Childhood"    Medications: I have reviewed the patient's current medications.  Results for orders placed or performed during the hospital encounter of 07/03/14 (from the past 48 hour(s))  CBC with Differential     Status: Abnormal   Collection Time: 07/03/14 12:58 AM  Result Value Ref Range   WBC 17.3 (H) 4.0 - 10.5 K/uL   RBC 5.33 4.22 - 5.81 MIL/uL   Hemoglobin 15.4 13.0 - 17.0 g/dL   HCT 93.1 09.1 - 45.6 %   MCV 86.1 78.0 - 100.0 fL   MCH 28.9 26.0 - 34.0 pg   MCHC 33.6 30.0 - 36.0 g/dL   RDW 02.7 82.9 - 60.3 %   Platelets 269 150 - 400 K/uL   Neutrophils Relative % 73 43 - 77 %   Neutro Abs 12.5 (H) 1.7 - 7.7 K/uL   Lymphocytes Relative 15 12 - 46 %   Lymphs Abs 2.7 0.7 - 4.0 K/uL   Monocytes Relative 9 3 - 12 %   Monocytes Absolute 1.6 (H) 0.1 - 1.0 K/uL   Eosinophils Relative 3 0 - 5  %   Eosinophils Absolute 0.5 0.0 - 0.7 K/uL   Basophils Relative 0 0 - 1 %   Basophils Absolute 0.0 0.0 - 0.1 K/uL  Comprehensive metabolic panel     Status: Abnormal   Collection Time: 07/03/14 12:58 AM  Result Value Ref Range   Sodium 142 137 - 147 mEq/L   Potassium 3.9 3.7 - 5.3 mEq/L   Chloride 104 96 - 112 mEq/L   CO2 21 19 - 32 mEq/L   Glucose, Bld 113 (H) 70 - 99 mg/dL   BUN 13 6 - 23 mg/dL   Creatinine, Ser 9.05 0.50 - 1.35 mg/dL   Calcium 9.6 8.4 - 64.6 mg/dL   Total Protein 7.7 6.0 - 8.3 g/dL   Albumin 4.1 3.5 - 5.2 g/dL   AST 18 0 - 37 U/L   ALT 30 0 - 53 U/L   Alkaline Phosphatase 56 39 - 117 U/L   Total Bilirubin 0.7 0.3 - 1.2 mg/dL   GFR calc non Af Amer >90 >90 mL/min   GFR calc Af Amer >90 >90 mL/min    Comment: (NOTE) The eGFR has been calculated using the CKD EPI equation. This calculation has not  been validated in all clinical situations. eGFR's persistently <90 mL/min signify possible Chronic Kidney Disease.    Anion gap 17 (H) 5 - 15  CK     Status: None   Collection Time: 07/03/14  6:45 AM  Result Value Ref Range   Total CK 113 7 - 232 U/L    No results found.  Pertinent items are noted in HPI. Temp:  [99.7 F (37.6 C)-100.9 F (38.3 C)] 99.7 F (37.6 C) (11/14 0443) Pulse Rate:  [84-96] 96 (11/14 0443) Resp:  [18-26] 20 (11/14 0443) BP: (134-152)/(68-76) 146/76 mmHg (11/14 0443) SpO2:  [96 %-98 %] 98 % (11/14 0443) Weight:  [142.883 kg (315 lb)] 142.883 kg (315 lb) (11/14 0041) General appearance: alert and cooperative Resp: clear to auscultation bilaterally Cardio: regular rate and rhythm GI: soft, non-tender; bowel sounds normal; no masses,  no organomegaly Extremities: extremities normal, atraumatic, no cyanosis or edema  Except for LUE: dorsal hand incision surrounding edema up forearm, erythema per marks reduced, still signif swelling of fingers with limited flexion/extension, n/v intact   Assessment: Abscess L hand, with  cellulitis Plan: Hand re I&D'd at bedside, significant amount of foul smelling pus evacuated.  Will start with wound wash outs, cont IV abx, may need additional I&D depending. I have discussed this treatment plan in detail with patient and/or family, including the risks of the recommended treatment or surgery, the benefits and the alternatives.  The patient and/or caregiver understands that additional treatment may be necessary.  Saanvi Hakala CHRISTOPHER 07/03/2014, 9:34 AM

## 2014-07-03 NOTE — Progress Notes (Addendum)
ANTIBIOTIC CONSULT NOTE - INITIAL  Pharmacy Consult for Vancomycin  Indication: Cellulitis  Allergies  Allergen Reactions  . Bee Venom Swelling    "Childhood"    Patient Measurements: Height: 6\' 2"  (188 cm) Weight: (!) 315 lb (142.883 kg) IBW/kg (Calculated) : 82.2  Vital Signs: Temp: 99.7 F (37.6 C) (11/14 0443) Temp Source: Oral (11/14 0041) BP: 146/76 mmHg (11/14 0443) Pulse Rate: 96 (11/14 0443)  Labs:  Recent Labs  07/03/14 0058  WBC 17.3*  HGB 15.4  PLT 269  CREATININE 0.83   Estimated Creatinine Clearance: 212.1 mL/min (by C-G formula based on Cr of 0.83).   Assessment: 23 y/o M with L-hand cellulitis/abscess, WBC elevated, renal function good, other labs as above.   Goal of Therapy:  Vancomycin trough level 10-15 mcg/ml  Plan:  -Vancomycin 1500 mg IV x 1 to complete 2500 mg LOAD, then give 1500 mg IV q12h -Trend WBC, temp, renal function  -Drug levels ASAP  Abran DukeLedford, Ziv Welchel 07/03/2014,5:28 AM

## 2014-07-03 NOTE — Plan of Care (Signed)
Problem: Phase I Progression Outcomes Goal: Pain controlled with appropriate interventions Outcome: Completed/Met Date Met:  07/03/14 Goal: OOB as tolerated unless otherwise ordered Outcome: Completed/Met Date Met:  07/03/14 Goal: Voiding-avoid urinary catheter unless indicated Outcome: Completed/Met Date Met:  07/03/14 Goal: Temperature < 102 Outcome: Completed/Met Date Met:  07/03/14 Goal: Wound assessment- dressing change as appropriate Outcome: Completed/Met Date Met:  07/03/14

## 2014-07-03 NOTE — ED Notes (Signed)
Pt states he used IV heroin last week. Pt had an unsuccessful attempt in left hand. An abscess has now formed from where he attempted to use IV heroin.

## 2014-07-03 NOTE — Progress Notes (Signed)
[facility] PROGRESS NOTE  David Beltran ZOX:[id number] DOB: 1991/03/18 DOA: 07/03/2014 PCP: No PCP Per Patient  Assessment/Plan: 1. Left hand abscess/cellulitis -Patient with history of heroin addiction injecting into dorsal region of left hand last week -Now complicated by development of cellulitis/abscess. He underwent incision and drainage by emergency room physician. -Patient was seen and evaluated by orthopedic surgery recommending wound washouts -will continue empiric IV antibiotic therapy with Zosyn and vancomycin, blood cultures are pending  2.  Heroin addiction -patient having a history of heroin addiction, reporting sharing needles in the past. -Outs old on drug addiction and offered support services and information on treatment centers -Will check HIV and hepatitis panel  Code Status: Full Code Family Communication:  Disposition Plan: continue supportive care, IV antimicrobial therapy, orthopedic consultation   Consultants:  Orthopedic Surgery  Procedures:  Incision and drainage of left hand abscess  Antibiotics:  Vancomycin  Zosyn  HPI/Subjective: Patient is a 23 year old with a past medical history of IV drug abuse, admitted on 07/03/2014 to the medicine service as he presented with complaints of increased left hand swelling and pain. He has a history of IV drug use with heroin, stated last using hair went about a week ago at which time he overdosed brought to the emergency department. He was found to have an abscess over dorsal region of his left hand, undergoing incision and drainage by ED physician. He was started on empiric IV antibiotic therapy with vancomycin and Zosyn. Patient was seen and evaluated by Dr Izora Ribasoley of orthopedics this hospitalization.  Objective: Filed Vitals:   07/03/14 0443  BP: 146/76  Pulse: 96  Temp: 99.7 F (37.6 C)  Resp: 20    Intake/Output Summary (Last 24 hours) at 07/03/14 1402 Last data filed at 07/03/14 0800  Gross per 24 hour  Intake      0 ml  Output      0 ml  Net      0 ml   Filed Weights   07/03/14 0041  Weight: 142.883 kg (315 lb)    Exam:   General:  Patient is awake and alert, oriented 3 in no acute distress  Cardiovascular: rate and rhythm normal S1-S2  Respiratory: regular rate and rhythm normal S1-S2  Abdomen: soft nontender nondistended  Musculoskeletal: patient having erythema and swelling involving dorsal aspect of left hand. Erythema spreading proximally over wrist region. This is painful to palpation.   Data Reviewed: Basic Metabolic Panel:  Recent Labs Lab 07/03/14 0058  NA 142  K 3.9  CL 104  CO2 21  GLUCOSE 113*  BUN 13  CREATININE 0.83  CALCIUM 9.6   Liver Function Tests:  Recent Labs Lab 07/03/14 0058  AST 18  ALT 30  ALKPHOS 56  BILITOT 0.7  PROT 7.7  ALBUMIN 4.1   No results for input(s): LIPASE, AMYLASE in the last 168 hours. No results for input(s): AMMONIA in the last 168 hours. CBC:  Recent Labs Lab 07/03/14 0058  WBC 17.3*  NEUTROABS 12.5*  HGB 15.4  HCT 45.9  MCV 86.1  PLT 269   Cardiac Enzymes:  Recent Labs Lab 07/03/14 0645  CKTOTAL 113   BNP (last 3 results) No results for input(s): PROBNP in the last 8760 hours. CBG: No results for input(s): GLUCAP in the last 168 hours.  No results found for this or any previous visit (from the past 240 hour(s)).   Studies: No results found.  Scheduled Meds: . [START ON 07/04/2014] enoxaparin (LOVENOX) injection  70 mg Subcutaneous Q24H  . famotidine  20 mg Oral Daily  . naproxen  500 mg Oral BID WC  . piperacillin-tazobactam (ZOSYN)  IV  3.375 g Intravenous 3 times per day  . vancomycin  1,500 mg Intravenous Q12H   Continuous Infusions: . sodium chloride 100 mL/hr at 07/03/14 0530    Principal Problem:   Abscess of left hand Active Problems:   Cellulitis   Drug abuse    Time spent:     Jeralyn BennettZAMORA, Pedrohenrique [place]  [facility] Pager [phone]. If  7PM-7AM, please contact night-coverage at [email or web address], password [email or web address] 07/03/2014, 2:02 PM  LOS: 0 days

## 2014-07-04 LAB — CBC
HEMATOCRIT: 39.4 % (ref 39.0–52.0)
HEMOGLOBIN: 12.7 g/dL — AB (ref 13.0–17.0)
MCH: 28.2 pg (ref 26.0–34.0)
MCHC: 32.2 g/dL (ref 30.0–36.0)
MCV: 87.4 fL (ref 78.0–100.0)
Platelets: 250 10*3/uL (ref 150–400)
RBC: 4.51 MIL/uL (ref 4.22–5.81)
RDW: 13.8 % (ref 11.5–15.5)
WBC: 10.3 10*3/uL (ref 4.0–10.5)

## 2014-07-04 LAB — COMPREHENSIVE METABOLIC PANEL
ALBUMIN: 3.3 g/dL — AB (ref 3.5–5.2)
ALK PHOS: 36 U/L — AB (ref 39–117)
ALT: 23 U/L (ref 0–53)
AST: 14 U/L (ref 0–37)
Anion gap: 11 (ref 5–15)
BUN: 11 mg/dL (ref 6–23)
CO2: 22 mEq/L (ref 19–32)
Calcium: 8.7 mg/dL (ref 8.4–10.5)
Chloride: 107 mEq/L (ref 96–112)
Creatinine, Ser: 0.78 mg/dL (ref 0.50–1.35)
GFR calc Af Amer: >90 mL/min (ref >90)
GFR calc non Af Amer: >90 mL/min (ref >90)
Glucose, Bld: 85 mg/dL (ref 70–99)
POTASSIUM: 4.2 meq/L (ref 3.7–5.3)
SODIUM: 140 meq/L (ref 137–147)
TOTAL PROTEIN: 6.1 g/dL (ref 6.0–8.3)
Total Bilirubin: 0.4 mg/dL (ref 0.3–1.2)

## 2014-07-04 LAB — PROTIME-INR
INR: 1.12 (ref 0.00–1.49)
Prothrombin Time: 14.5 seconds (ref 11.6–15.2)

## 2014-07-04 NOTE — Progress Notes (Signed)
S:pt states L hand feels better,  Nurses performing wound care  O:Blood pressure 122/69, pulse 67, temperature 97.8 F (36.6 C), temperature source Oral, resp. rate 16, height 6\' 2"  (1.88 m), weight 146.194 kg (322 lb 4.8 oz), SpO2 100 %.  L Hand: decreased swelling, still with some purulent drainage, wound gently packed  A: L hand abscess, s/p I&D  - improving   P:pt needs continued wound wash outs 4x daily until drainage decreases, cont abx

## 2014-07-04 NOTE — Plan of Care (Signed)
Problem: Phase II Progression Outcomes Goal: Temperature < 101 Outcome: Completed/Met Date Met:  07/04/14 Goal: Tolerating diet Outcome: Completed/Met Date Met:  07/04/14

## 2014-07-04 NOTE — Progress Notes (Signed)
[facility] PROGRESS NOTE  David Beltran HQI:[id number] DOB: [birth year] DOA: 07/03/2014 PCP: No PCP Per Patient  Assessment/Plan: 1. Left hand abscess/cellulitis -Patient with history of heroin addiction injecting into dorsal region of left hand last week -Now complicated by development of cellulitis/abscess. He underwent incision and drainage by emergency room physician. -Patient was seen and evaluated by orthopedic surgery recommending wound washouts -On exam his hand appears improved, with decreased erythema and swelling, will continue Vanc and Zosyn -Blood cultures showing no growth to date.   2.  Heroin addiction -patient having a history of heroin addiction, reporting sharing needles in the past. -Outs old on drug addiction and offered support services and information on treatment centers -HIV and Hepatitis non-reactive.   Code Status: Full Code Family Communication:  Disposition Plan: continue supportive care, IV antimicrobial therapy, orthopedic consultation   Consultants:  Orthopedic Surgery  Procedures:  Incision and drainage of left hand abscess  Antibiotics:  Vancomycin  Zosyn  HPI/Subjective: Patient is a 23 year old with a past medical history of IV drug abuse, admitted on 07/03/2014 to the medicine service as he presented with complaints of increased left hand swelling and pain. He has a history of IV drug use with heroin, stated last using hair went about a week ago at which time he overdosed brought to the emergency department. He was found to have an abscess over dorsal region of his left hand, undergoing incision and drainage by ED physician. He was started on empiric IV antibiotic therapy with vancomycin and Zosyn. Patient was seen and evaluated by Dr Izora Ribasoley of orthopedics this hospitalization.  Objective: Filed Vitals:   07/04/14 0720  BP: 122/69  Pulse: 67  Temp: 97.8 F (36.6 C)  Resp: 16    Intake/Output Summary (Last 24 hours) at  07/04/14 1218 Last data filed at 07/03/14 1700  Gross per 24 hour  Intake    480 ml  Output      0 ml  Net    480 ml   Filed Weights   07/03/14 0041 07/04/14 0720  Weight: 142.883 kg (315 lb) 146.194 kg (322 lb 4.8 oz)    Exam:   General:  Patient is awake and alert, oriented 3 in no acute distress  Cardiovascular: rate and rhythm normal S1-S2  Respiratory: regular rate and rhythm normal S1-S2  Abdomen: soft nontender nondistended       Musculoskeletal: There is improvement to erythema and swelling of left hand  Data Reviewed: Basic Metabolic Panel:  Recent Labs Lab 07/03/14 0058 07/04/14 0613  NA 142 140  K 3.9 4.2  CL 104 107  CO2 21 22  GLUCOSE 113* 85  BUN 13 11  CREATININE 0.83 0.78  CALCIUM 9.6 8.7   Liver Function Tests:  Recent Labs Lab 07/03/14 0058 07/04/14 0613  AST 18 14  ALT 30 23  ALKPHOS 56 36*  BILITOT 0.7 0.4  PROT 7.7 6.1  ALBUMIN 4.1 3.3*   No results for input(s): LIPASE, AMYLASE in the last 168 hours. No results for input(s): AMMONIA in the last 168 hours. CBC:  Recent Labs Lab 07/03/14 0058 07/03/14 1445 07/04/14 0613  WBC 17.3* 13.0* 10.3  NEUTROABS 12.5*  --   --   HGB 15.4 14.0 12.7*  HCT 45.9 42.9 39.4  MCV 86.1 87.0 87.4  PLT 269 239 250   Cardiac Enzymes:  Recent Labs Lab 07/03/14 0645  CKTOTAL 113   BNP (last 3 results) No results for input(s): PROBNP in the last 8760  hours. CBG: No results for input(s): GLUCAP in the last 168 hours.  Recent Results (from the past 240 hour(s))  Culture, blood (routine x 2)     Status: None (Preliminary result)   Collection Time: 07/03/14  7:25 AM  Result Value Ref Range Status   Specimen Description BLOOD RIGHT HAND  Final   Special Requests BOTTLES DRAWN AEROBIC ONLY 10CC  Final   Culture  Setup Time   Final    07/03/2014 20:12 [facility]    Culture   Final           BLOOD CULTURE RECEIVED NO GROWTH TO DATE CULTURE WILL BE HELD FOR 5 DAYS  BEFORE ISSUING A FINAL NEGATIVE REPORT [facility]    Report Status PENDING  Incomplete  Culture, blood (routine x 2)     Status: None (Preliminary result)   Collection Time: 07/03/14  7:30 AM  Result Value Ref Range Status   Specimen Description BLOOD LEFT ARM  Final   Special Requests BOTTLES DRAWN AEROBIC ONLY 10CC  Final   Culture  Setup Time   Final    07/03/2014 20:12 [facility]formed at [facility]    Culture   Final           BLOOD CULTURE RECEIVED NO GROWTH TO DATE CULTURE WILL BE HELD FOR 5 DAYS BEFORE ISSUING A FINAL NEGATIVE REPORT [facility]formed at [facility]    Report Status PENDING  Incomplete     Studies: No results found.  Scheduled Meds: . enoxaparin (LOVENOX) injection  70 mg Subcutaneous Q24H  . famotidine  20 mg Oral Daily  . naproxen  500 mg Oral BID WC  . nicotine  21 mg Transdermal Daily  . piperacillin-tazobactam (ZOSYN)  IV  3.375 g Intravenous 3 times per day  . vancomycin  1,500 mg Intravenous Q12H   Continuous Infusions:    Principal Problem:   Abscess of left hand Active Problems:   Cellulitis   Drug abuse    Time spent: 25 min    Jeralyn BennettZAMORA, David Beltran  [facility] Pager [phone]. If 7PM-7AM, please contact night-coverage at [email or web address], password [email or web address] 07/04/2014, 12:18 PM  LOS: 1 day

## 2014-07-05 DIAGNOSIS — L03114 Cellulitis of left upper limb: Principal | ICD-10-CM

## 2014-07-05 DIAGNOSIS — F1910 Other psychoactive substance abuse, uncomplicated: Secondary | ICD-10-CM | POA: Insufficient documentation

## 2014-07-05 LAB — VANCOMYCIN, TROUGH: Vancomycin Tr: 8.7 ug/mL — ABNORMAL LOW (ref 10.0–20.0)

## 2014-07-05 MED ORDER — VANCOMYCIN HCL 10 G IV SOLR
1250.0000 mg | Freq: Three times a day (TID) | INTRAVENOUS | Status: DC
  Filled 2014-07-05 (×3): qty 1250

## 2014-07-05 MED ORDER — SULFAMETHOXAZOLE-TRIMETHOPRIM 800-160 MG PO TABS: 1.0000 | ORAL_TABLET | Freq: Two times a day (BID) | ORAL | Status: DC

## 2014-07-05 NOTE — Discharge Summary (Signed)
Physician Discharge Summary  David MinsJohnathan W Bourne XLK:[id number] DOB: Aug 14, 1991 DOA: 07/03/2014  PCP: No PCP Per Patient  Admit date: 07/03/2014 Discharge date: 07/05/2014  Time spent: 35 minutes  Recommendations for Outpatient Follow-up:  1. Please follow-up on left hand cellulitis/abscess 2. Patient instructed to continue wound washouts at home with gentle packing with gauze  Discharge Diagnoses:  Principal Problem:   Abscess of left hand Active Problems:   Cellulitis   Drug abuse   IV drug abuse   Cellulitis of arm, left   Discharge Condition: Stable/improved  Diet recommendation: Regular diet  Filed Weights   07/03/14 0041 07/04/14 0720 07/05/14 0620  Weight: 142.883 kg (315 lb) 146.194 kg (322 lb 4.8 oz) 144.335 kg (318 lb 3.2 oz)    History of present illness:  David Beltran is a 23 y.o. male with Past medical history of drug abuse and cellulitis. 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 He missed on the second time while injecting and had an injury with the needle. This was followed by all obtained showed drug overdose and he was seen in the ER. Later on he started having progressively worsening redness and pain as well as swelling of the left hand. He mentions increased to the point that he was unable to move the hand due to severe swelling and therefore he decided to come to the hospital. He complains of fever as well as chills. No chest pain or shortness of breath no diarrhea no burning urination.  The patient is coming from home And at his baseline independent for most of his ADL.  Hospital Course:  Patient is a 23 year old with a past medical history of IV drug abuse, admitted on 07/03/2014 to the medicine service as he presented with complaints of increased left hand swelling and pain. He has a history  of IV drug use with heroin, stated last using heroine about a week ago at which time he overdosed brought to the emergency department. He presented back to the emergency department on 07/03/2014 with complaints of left hand pain and swelling, was found to have an abscess over dorsal region of his left hand, undergoing incision and drainage by ED physician. He was started on empiric IV antibiotic therapy with vancomycin and Zosyn. Patient was seen and evaluated by Dr Izora Ribasoley of orthopedics this hospitalization. Patient showed gradual clinical improvement, discharged in stable condition on 07/05/2014. [facility] work was consulted regarding providing outpatient resources for drug addiction.  Procedures:  Incision and drainage of left hand abscess formed on 07/03/2014  Consultations:  Dr Izora Ribasoley of Hand Surgery  Discharge Exam: Filed Vitals:   07/05/14 0620  BP: 127/69  Pulse: 64  Temp: 97.6 F (36.4 C)  Resp: 18    General: Patient is awake and alert, oriented 3 in no acute distress  Cardiovascular: rate and rhythm normal S1-S2  Respiratory: regular rate and rhythm normal S1-S2  Abdomen: soft nontender nondistended    Musculoskeletal: there has been interim significant improvement to his left hand cellulitis with resolution to erythema and swelling. There was no discharge noted from site of incision and drainage.   Discharge Instructions You were cared for by a hospitalist during your hospital stay. If you have any questions about your discharge medications or the care you received while you were in the hospital after you  are discharged, you can call the unit and asked to speak with the hospitalist on call if the hospitalist that took care of you is not available. Once you are discharged, your primary care physician will handle any further medical issues. Please note that NO REFILLS for any discharge medications will be authorized once you are discharged, as it is imperative that you  return to your primary care physician (or establish a relationship with a primary care physician if you do not have one) for your aftercare needs so that they can reassess your need for medications and monitor your lab values.  Discharge Instructions    Call MD for:  difficulty breathing, headache or visual disturbances    Complete by:  As directed      Call MD for:  extreme fatigue    Complete by:  As directed      Call MD for:  hives    Complete by:  As directed      Call MD for:  persistant dizziness or light-headedness    Complete by:  As directed      Call MD for:  persistant nausea and vomiting    Complete by:  As directed      Call MD for:  redness, tenderness, or signs of infection (pain, swelling, redness, odor or green/yellow discharge around incision site)    Complete by:  As directed      Call MD for:  severe uncontrolled pain    Complete by:  As directed      Call MD for:  temperature >100.4    Complete by:  As directed      Diet - low sodium heart healthy    Complete by:  As directed      Increase activity slowly    Complete by:  As directed           Current Discharge Medication List    CONTINUE these medications which have CHANGED   Details  sulfamethoxazole-trimethoprim (BACTRIM DS,SEPTRA DS) 800-160 MG per tablet Take 1 tablet by mouth 2 (two) times daily. Qty: 14 tablet, Refills: 0      CONTINUE these medications which have NOT CHANGED   Details  ibuprofen (ADVIL,MOTRIN) 200 MG tablet Take 600 mg by mouth every 6 (six) hours as needed for moderate pain.      STOP taking these medications     cephALEXin (KEFLEX) 500 MG capsule      oxyCODONE-acetaminophen (PERCOCET) 5-325 MG per tablet        Allergies  Allergen Reactions  . Bee Venom Swelling    "Childhood"   Follow-up Information    Follow up with No PCP Per Patient.   Specialty:  General Practice       The results of significant diagnostics from this hospitalization (including imaging,  microbiology, ancillary and laboratory) are listed below for reference.    Significant Diagnostic Studies: No results found.  Microbiology: Recent Results (from the past 240 hour(s))  Culture, blood (routine x 2)     Status: None (Preliminary result)   Collection Time: 07/03/14  7:25 AM  Result Value Ref Range Status   Specimen Description BLOOD RIGHT HAND  Final   Special Requests BOTTLES DRAWN AEROBIC ONLY 10CC  Final   Culture  Setup Time   Final    07/03/2014 20:12 [facility]formed at [facility]    Culture   Final           BLOOD CULTURE RECEIVED NO GROWTH TO  DATE CULTURE WILL BE HELD FOR 5 DAYS BEFORE ISSUING A FINAL NEGATIVE REPORT Performed at [facility]    Report Status PENDING  Incomplete  Culture, blood (routine x 2)     Status: None (Preliminary result)   Collection Time: 07/03/14  7:30 AM  Result Value Ref Range Status   Specimen Description BLOOD LEFT ARM  Final   Special Requests BOTTLES DRAWN AEROBIC ONLY 10CC  Final   Culture  Setup Time   Final    07/03/2014 20:12 Performed at [facility]    Culture   Final           BLOOD CULTURE RECEIVED NO GROWTH TO DATE CULTURE WILL BE HELD FOR 5 DAYS BEFORE ISSUING A FINAL NEGATIVE REPORT Performed at [facility]    Report Status PENDING  Incomplete  Culture, routine-abscess     Status: None (Preliminary result)   Collection Time: 07/04/14  5:00 AM  Result Value Ref Range Status   Specimen Description ABSCESS LEFT HAND  Final   Special Requests NONE  Final   Gram Stain PENDING  Incomplete   Culture   Final    NO GROWTH 1 DAY Performed at [facility]    Report Status PENDING  Incomplete     Labs: Basic Metabolic Panel:  Recent Labs Lab 07/03/14 0058 07/04/14 0613  NA 142 140  K 3.9 4.2  CL 104 107  CO2 21 22  GLUCOSE 113* 85  BUN 13 11  CREATININE 0.83 0.78  CALCIUM 9.6 8.7   Liver Function Tests:  Recent Labs Lab 07/03/14 0058 07/04/14 0613  AST 18 14   ALT 30 23  ALKPHOS 56 36*  BILITOT 0.7 0.4  PROT 7.7 6.1  ALBUMIN 4.1 3.3*   No results for input(s): LIPASE, AMYLASE in the last 168 hours. No results for input(s): AMMONIA in the last 168 hours. CBC:  Recent Labs Lab 07/03/14 0058 07/03/14 1445 07/04/14 0613  WBC 17.3* 13.0* 10.3  NEUTROABS 12.5*  --   --   HGB 15.4 14.0 12.7*  HCT 45.9 42.9 39.4  MCV 86.1 87.0 87.4  PLT 269 239 250   Cardiac Enzymes:  Recent Labs Lab 07/03/14 0645  CKTOTAL 113   BNP: BNP (last 3 results) No results for input(s): PROBNP in the last 8760 hours. CBG: No results for input(s): GLUCAP in the last 168 hours.     SignedJeralyn Bennett  [facility] 07/05/2014, 3:07 PM

## 2014-07-05 NOTE — Plan of Care (Signed)
Problem: Phase I Progression Outcomes Goal: Initial discharge plan identified Outcome: Completed/Met Date Met:  07/05/14

## 2014-07-05 NOTE — Progress Notes (Signed)
Hx of heroin abuse

## 2014-07-05 NOTE — Plan of Care (Signed)
Problem: Discharge Progression Outcomes Goal: Discharge plan in place and appropriate Outcome: Completed/Met Date Met:  07/05/14 Goal: Pain controlled with appropriate interventions Outcome: Completed/Met Date Met:  07/05/14 Goal: Hemodynamically stable Outcome: Completed/Met Date Met:  07/05/14 Goal: Tolerating diet Outcome: Completed/Met Date Met:  07/05/14 Goal: Activity appropriate for discharge plan Outcome: Completed/Met Date Met:  07/05/14 Goal: [place] arrangements in place Outcome: Completed/Met Date Met:  07/05/14 Goal: Wound improving/decreased edema Outcome: Completed/Met Date Met:  07/05/14 Goal: Patient verbalizes wound care regimen Outcome: Completed/Met Date Met:  07/05/14 Goal: Other Discharge Outcomes/Goals Outcome: Completed/Met Date Met:  07/05/14

## 2014-07-05 NOTE — Plan of Care (Signed)
Problem: Phase II Progression Outcomes Goal: Discharge plan established Outcome: Completed/Met Date Met:  07/05/14

## 2014-07-05 NOTE — Plan of Care (Signed)
Problem: Phase III Progression Outcomes Goal: Temperature < 100 Outcome: Completed/Met Date Met:  07/05/14

## 2014-07-05 NOTE — Progress Notes (Signed)
S: Hand feels better  O:Blood pressure 127/69, pulse 64, temperature 97.6 F (36.4 C), temperature source Oral, resp. rate 18, height 6\' 2"  (1.88 m), weight 144.335 kg (318 lb 3.2 oz), SpO2 99 %.  L Hand, decreased swelling and erythema, minimal drainage  A:s/p I&D L hand abscess   P:cont wound washouts at home gentle packing with gauze, then cover, home on oral abx; f/u as needed

## 2014-07-05 NOTE — Plan of Care (Signed)
Problem: Phase III Progression Outcomes Goal: Other Phase III Outcomes/Goals Outcome: Completed/Met Date Met:  07/05/14

## 2014-07-05 NOTE — Plan of Care (Signed)
Problem: Discharge Progression Outcomes Goal: Barriers To Progression Addressed/Resolved Outcome: Completed/Met Date Met:  07/05/14     

## 2014-07-05 NOTE — Plan of Care (Signed)
Problem: Phase III Progression Outcomes Goal: Activity at appropriate level-compared to baseline (UP IN CHAIR FOR HEMODIALYSIS)  Outcome: Completed/Met Date Met:  07/05/14     

## 2014-07-05 NOTE — Plan of Care (Signed)
Problem: Phase III Progression Outcomes Goal: Wound care performed by pt/family Outcome: Completed/Met Date Met:  07/05/14

## 2014-07-05 NOTE — Plan of Care (Signed)
Problem: Phase I Progression Outcomes Goal: Hemodynamically stable Outcome: Completed/Met Date Met:  07/05/14     

## 2014-07-05 NOTE — Progress Notes (Signed)
ANTIBIOTIC CONSULT NOTE - INITIAL  Pharmacy Consult for Vancomycin  Indication: Cellulitis  Allergies  Allergen Reactions  . Bee Venom Swelling    "Childhood"    Patient Measurements: Height: 6\' 2"  (188 cm) Weight: (!) 322 lb 4.8 oz (146.194 kg) IBW/kg (Calculated) : 82.2  Vital Signs: Temp: 98.9 F (37.2 C) (11/15 2231) Temp Source: Oral (11/15 2231) BP: 147/76 mmHg (11/15 2231) Pulse Rate: 77 (11/15 2231)  Labs:  Recent Labs  07/03/14 0058 07/03/14 1445 07/04/14 0613  WBC 17.3* 13.0* 10.3  HGB 15.4 14.0 12.7*  PLT 269 239 250  CREATININE 0.83  --  0.78   Estimated Creatinine Clearance: 222.7 mL/min (by C-G formula based on Cr of 0.78).   Assessment: 23 y/o M with L-hand cellulitis/abscess, WBC trending down, renal function good, vancomycin trough is slightly sub-therapeutic at 8.7.   Goal of Therapy:  Vancomycin trough level 10-15 mcg/ml  Plan:  -Change vancomycin to 1250 mg IV q8h -Trend WBC, temp, renal function  -Re-check VT at steady state  Abran DukeLedford, Claudie Brickhouse 07/05/2014,5:55 AM

## 2014-07-05 NOTE — Plan of Care (Signed)
Problem: Phase II Progression Outcomes Goal: Progress activity as tolerated unless otherwise ordered Outcome: Completed/Met Date Met:  07/05/14     

## 2014-07-05 NOTE — Plan of Care (Signed)
Problem: Phase I Progression Outcomes Goal: Other Phase I Outcomes/Goals Outcome: Completed/Met Date Met:  07/05/14

## 2014-07-05 NOTE — Clinical Social Work Psychosocial (Signed)
Clinical Social Work Department BRIEF PSYCHOSOCIAL ASSESSMENT 07/05/2014  Patient:  David Beltran, David Beltran     Account Number:  [id number]     Admit date:  09/11/2013  Clinical Social Worker:  Wylene Men  Date/Time:  07/05/2014 12:00 N  Referred by:  Physician  Date Referred:  07/05/2014 Referred for  Psychosocial assessment  Other - See comment   Other Referral:   IV heroin user   Interview type:  Patient Other interview type:   none    PSYCHOSOCIAL DATA Living Status:  PARENTS Admitted from facility:   Level of care:   Primary support name:  Arbie Cookey Primary support relationship to patient:  PARENT Degree of support available:   adequate    CURRENT CONCERNS Current Concerns  Other - See comment   Other Concerns:   Patient has hx of IV heroin use    SOCIAL WORK ASSESSMENT / PLAN CSW met with pt at bedside.  Pt was alert and oriented x4 during the course of the assessment. Pt was admitted to [facility] status post IV heroin use and became unresponsive.  Pt denies suicide attempt- reporting only attempting to get high.  Patient has developed absess/cellulitis at the last insertion point.  Patient recognizes heroin use as a problem, but is not at the point of change at this time.  Patient requests outpatient resources to follow up with once medically discharged. Patient denied any SA assistance in the past, but of report and noted in the chart, pt has stated to other staff that he has received treatment in the past - presumably outpatient.  CSW reviewed substance abuse resources with patient and advised of waitlists for residential treatment facilities and provided education on the different listed facilities.  patient is agreeable to follow-up outpatient at this time.  Patient states he does not feel that inpatient/residential treatment is needed at this time and reports having to complete "community services" for a current legal issue as a barrier to residential treatment. CSW  unsure of projected dc at this time.  Patient states he believes projected dc to be tomorrow (07/06/2014).  Patient is agreeable.   Assessment/plan status:  Psychosocial Support/Ongoing Assessment of Needs Other assessment/ plan:   SBIRT   Information/referral to community resources:   SA resources  24hr-CRISIS line    PATIENT'S/FAMILY'S RESPONSE TO PLAN OF CARE: Patient was agreeable to outpatient resources only.  MD and RN updated and aware of patient's request to dc home with outpatient resources and follow-up.       Nonnie Done, [place] [phone]  Psychiatric & Orthopedics (5N 1-16) Clinical Social Worker

## 2014-07-05 NOTE — Progress Notes (Signed)
Utilization review completed.  

## 2014-07-05 NOTE — Plan of Care (Signed)
Problem: Phase III Progression Outcomes Goal: IV Meds changed to PO Outcome: Completed/Met Date Met:  07/05/14

## 2014-07-05 NOTE — Progress Notes (Signed)
lovenox                 

## 2014-07-05 NOTE — Plan of Care (Signed)
Problem: Phase III Progression Outcomes Goal: Discharge plan remains appropriate-arrangements made Outcome: Completed/Met Date Met:  07/05/14     

## 2014-07-05 NOTE — Plan of Care (Signed)
Problem: Phase II Progression Outcomes Goal: Other Phase II Outcomes/Goals Outcome: Completed/Met Date Met:  07/05/14     

## 2014-07-05 NOTE — Plan of Care (Signed)
Problem: Phase II Progression Outcomes Goal: Wound without signs/symptoms of infection, decreasing edema Outcome: Completed/Met Date Met:  07/05/14

## 2014-07-06 NOTE — Care Management Note (Signed)
CARE MANAGEMENT NOTE 07/06/2014  Patient:  David Beltran,David Beltran   Account Number:  [id number]  Date Initiated:  07/06/2014  Documentation initiated by:  Vance PeperBRADY,Pippa Hanif  Subjective/Objective Assessment:   23 yr old male admitted with abscess of his left hand. Patient had I & D of left hand.     Action/Plan:   Patient discharged prior to case manager speaking with him. CM left voice message for patient to contact her regarding appointment [facility]. Patient will have to make contact himself, per [facility] staff.   Anticipated DC Date:  07/05/2014   Anticipated DC Plan:  HOME/SELF CARE      DC Planning Services  CM consult      PAC Choice  NA   Choice offered to / List presented to:  NA   DME arranged  NA        HH arranged  NA      Status of service:  Completed, signed off Medicare Important Message given?   (If response is "NO", the following Medicare IM given date fields will be blank) Date Medicare IM given:   Medicare IM given by:   Date Additional Medicare IM given:   Additional Medicare IM given by:    Discharge Disposition:  HOME/SELF CARE  Per UR Regulation:  Reviewed for med. necessity/level of care/duration of stay  If discussed at Long Length of Stay Meetings, dates discussed:    Comments:  07/06/14  11:16 am Vance PeperSusan Kaedyn Belardo, RN BSN Case Manager Case manager mailed information to patient concerning [facility].

## 2014-07-07 LAB — CULTURE, ROUTINE-ABSCESS

## 2014-07-09 LAB — CULTURE, BLOOD (ROUTINE X 2)
CULTURE: NO GROWTH
Culture: NO GROWTH

## 2016-06-06 ENCOUNTER — Emergency Department (HOSPITAL_COMMUNITY): Payer: Self-pay

## 2016-06-06 ENCOUNTER — Encounter (HOSPITAL_COMMUNITY): Payer: Self-pay

## 2016-06-06 ENCOUNTER — Emergency Department (HOSPITAL_COMMUNITY)
Admission: EM | Admit: 2016-06-06 | Discharge: 2016-06-06 | Disposition: A | Discharge status: Home / Self Care | Payer: Self-pay | Attending: Emergency Medicine | Admitting: Emergency Medicine

## 2016-06-06 DIAGNOSIS — F17210 Nicotine dependence, cigarettes, uncomplicated: Secondary | ICD-10-CM | POA: Insufficient documentation

## 2016-06-06 DIAGNOSIS — T401X1A Poisoning by heroin, accidental (unintentional), initial encounter: Secondary | ICD-10-CM | POA: Insufficient documentation

## 2016-06-06 LAB — COMPREHENSIVE METABOLIC PANEL
ALBUMIN: 4.4 g/dL (ref 3.5–5.0)
ALK PHOS: 37 U/L — AB (ref 38–126)
ALT: 57 U/L (ref 17–63)
ANION GAP: 9 (ref 5–15)
AST: 54 U/L — ABNORMAL HIGH (ref 15–41)
BILIRUBIN TOTAL: 0.6 mg/dL (ref 0.3–1.2)
BUN: 14 mg/dL (ref 6–20)
CALCIUM: 9.3 mg/dL (ref 8.9–10.3)
CO2: 25 mmol/L (ref 22–32)
CREATININE: 0.96 mg/dL (ref 0.61–1.24)
Chloride: 107 mmol/L (ref 101–111)
GFR calc Af Amer: >60 mL/min (ref >60)
GFR calc non Af Amer: >60 mL/min (ref >60)
GLUCOSE: 109 mg/dL — AB (ref 65–99)
Potassium: 3.9 mmol/L (ref 3.5–5.1)
Sodium: 141 mmol/L (ref 135–145)
TOTAL PROTEIN: 7.5 g/dL (ref 6.5–8.1)

## 2016-06-06 LAB — RAPID URINE DRUG SCREEN, HOSP PERFORMED
Amphetamines: NOT DETECTED
Barbiturates: NOT DETECTED
Benzodiazepines: NOT DETECTED
Cocaine: NOT DETECTED
Opiates: POSITIVE — AB
Tetrahydrocannabinol: POSITIVE — AB

## 2016-06-06 LAB — URINE MICROSCOPIC-ADD ON

## 2016-06-06 LAB — URINALYSIS, ROUTINE W REFLEX MICROSCOPIC
Bilirubin Urine: NEGATIVE
Glucose, UA: NEGATIVE mg/dL
Hgb urine dipstick: NEGATIVE
KETONES UR: NEGATIVE mg/dL
LEUKOCYTES UA: NEGATIVE
NITRITE: NEGATIVE
PROTEIN: 30 mg/dL — AB
Specific Gravity, Urine: 1.02 (ref 1.005–1.030)
pH: 6 (ref 5.0–8.0)

## 2016-06-06 LAB — ETHANOL

## 2016-06-06 LAB — ACETAMINOPHEN LEVEL: Acetaminophen (Tylenol), Serum: <10 ug/mL — ABNORMAL LOW (ref 10–30)

## 2016-06-06 MED ORDER — SODIUM CHLORIDE 0.9 % IV SOLN: INTRAVENOUS | Status: DC

## 2016-06-06 MED ORDER — SODIUM CHLORIDE 0.9 % IV BOLUS (SEPSIS)
1000.0000 mL | Freq: Once | INTRAVENOUS | Status: AC
  Administered 2016-06-06: 1000 mL via INTRAVENOUS

## 2016-06-06 NOTE — ED Notes (Signed)
Pt cannot use restroom at this time, aware urine specimen is needed.  

## 2016-06-06 NOTE — ED Triage Notes (Signed)
He was found unarousable in his vehicle in a local parking lot. EMS were phoned and they administered IV Narcan with resultant awakening of pt. He arrives in E.D. In no distress and is oriented x 4 with clear speech.

## 2016-06-06 NOTE — ED Provider Notes (Signed)
[facility]-EMERGENCY DEPT Provider Note   CSN: [id number] Arrival date & time: 06/06/16  1446     History   Chief Complaint Chief Complaint  Patient presents with  . Drug Overdose    HPI David Beltran is a 25 y.o. male.  Pt has a hx of heroin abuse.  He was using in his car.  He last used 4 days ago.  The pt had a friend in his car who called EMS when pt became unresponsive.  The pt was given narcan by EMS.  Pt is awake and alert and has no complaints now.      No past medical history on file.  Patient Active Problem List   Diagnosis Date Noted  . IV drug abuse   . Cellulitis of arm, left   . Abscess of left hand 07/03/2014  . Cellulitis 07/03/2014  . Drug abuse 07/03/2014    Past Surgical History:  Procedure Laterality Date  . FOOT SURGERY Left   . INCISION AND DRAINAGE ABSCESS Left    Hand       Home Medications    Prior to Admission medications   Not on File    Family History No family history on file.  Social History Social History  Substance Use Topics  . Smoking status: Current Some Day Smoker    Types: Cigarettes  . Smokeless tobacco: Not on file  . Alcohol use Yes     Comment: not regularly     Allergies   Bee venom   Review of Systems Review of Systems  All other systems reviewed and are negative.    Physical Exam Updated Vital Signs BP 142/73 (BP Location: Right Arm)   Pulse 99   Temp 98.2 F (36.8 C) (Oral)   Resp 16   SpO2 95%   Physical Exam  Constitutional: He is oriented to person, place, and time. He appears well-developed and well-nourished.  HENT:  Head: Normocephalic and atraumatic.  Right Ear: External ear normal.  Left Ear: External ear normal.  Nose: Nose normal.  Mouth/Throat: Oropharynx is clear and moist.  Eyes: Conjunctivae and EOM are normal. Pupils are equal, round, and reactive to light.  Neck: Normal range of motion. Neck supple.  Cardiovascular: Normal rate, regular rhythm, normal heart sounds  and intact distal pulses.   Pulmonary/Chest: Effort normal and breath sounds normal.  Abdominal: Soft. Bowel sounds are normal.  Musculoskeletal: Normal range of motion.  Neurological: He is alert and oriented to person, place, and time.  Skin: Skin is warm.  Multiple track marks on skin  Psychiatric: He has a normal mood and affect. His behavior is normal. Judgment and thought content normal.  Nursing note and vitals reviewed.    ED Treatments / Results  Labs (all labs ordered are listed, but only abnormal results are displayed) Labs Reviewed  ACETAMINOPHEN LEVEL - Abnormal; Notable for the following:       Result Value   Acetaminophen (Tylenol), Serum <10 (*)    All other components within normal limits  COMPREHENSIVE METABOLIC PANEL - Abnormal; Notable for the following:    Glucose, Bld 109 (*)    AST 54 (*)    Alkaline Phosphatase 37 (*)    All other components within normal limits  RAPID URINE DRUG SCREEN, HOSP PERFORMED - Abnormal; Notable for the following:    Opiates POSITIVE (*)    Tetrahydrocannabinol POSITIVE (*)    All other components within normal limits  URINALYSIS, ROUTINE W REFLEX MICROSCOPIC (NOT  AT [facility]) - Abnormal; Notable for the following:    Protein, ur 30 (*)    All other components within normal limits  URINE MICROSCOPIC-ADD ON - Abnormal; Notable for the following:    Squamous Epithelial / LPF 0-5 (*)    Bacteria, UA RARE (*)    All other components within normal limits  ETHANOL    EKG  EKG Interpretation  Date/Time:  Wednesday June 06 2016 14:58:27 EDT Ventricular Rate:  87 PR Interval:    QRS Duration: 112 QT Interval:  372 QTC Calculation: 448 R Axis:   64 Text Interpretation:  Sinus rhythm Borderline intraventricular conduction delay Abnormal inferior Q waves Borderline ST elevation, anterior leads Confirmed by Particia Nearing MD, Leverne Amrhein ([id number]) on 06/06/2016 3:15:50 PM       Radiology Dg Chest Port 1 View  Result Date:  06/06/2016 CLINICAL DATA:  Over dose. Per chart: He was found unarousable in his vehicle Some day smoker No sx EXAM: PORTABLE CHEST 1 VIEW COMPARISON:  07/13/2013 FINDINGS: Normal mediastinum and cardiac silhouette. Normal pulmonary vasculature. No evidence of effusion, infiltrate, or pneumothorax. No acute bony abnormality. IMPRESSION: Normal chest radiograph Electronically Signed   By: Genevive Bi M.D.   On: 06/06/2016 15:27    Procedures Procedures (including critical care time)  Medications Ordered in ED Medications  sodium chloride 0.9 % bolus 1,000 mL (1,000 mLs Intravenous New Bag/Given 06/06/16 1634)    And  0.9 %  sodium chloride infusion (not administered)     Initial Impression / Assessment and Plan / ED Course  I have reviewed the triage vital signs and the nursing notes.  Pertinent labs & imaging results that were available during my care of the patient were reviewed by me and considered in my medical decision making (see chart for details).  Clinical Course    Pt observed for a number of hours.  He has remained stable.   I spoke with pt regarding his heroin abuse and how dangerous it is.  He understands.  He is given a Facilities manager for outpatient counseling.  Final Clinical Impressions(s) / ED Diagnoses   Final diagnoses:  Accidental overdose of heroin, initial encounter    New Prescriptions New Prescriptions   No medications on file     Jacalyn Lefevre, MD 06/06/16 1726

## 2016-06-06 NOTE — ED Notes (Signed)
Bed: RESA Expected date:  Expected time:  Means of arrival:  Comments: EMS-OD heroin

## 2016-06-16 ENCOUNTER — Encounter (HOSPITAL_COMMUNITY): Payer: Self-pay | Admitting: Emergency Medicine

## 2016-06-16 ENCOUNTER — Ambulatory Visit (HOSPITAL_COMMUNITY)
Admission: EM | Admit: 2016-06-16 | Discharge: 2016-06-16 | Disposition: A | Discharge status: Home / Self Care | Payer: Self-pay | Attending: Internal Medicine | Admitting: Internal Medicine

## 2016-06-16 DIAGNOSIS — L03113 Cellulitis of right upper limb: Secondary | ICD-10-CM

## 2016-06-16 MED ORDER — CEFTRIAXONE SODIUM 1 G IJ SOLR
INTRAMUSCULAR | Status: AC
  Filled 2016-06-16: qty 10

## 2016-06-16 MED ORDER — CEPHALEXIN 500 MG PO CAPS: 500.0000 mg | ORAL_CAPSULE | Freq: Four times a day (QID) | ORAL | 0 refills | Status: DC

## 2016-06-16 MED ORDER — LIDOCAINE HCL (PF) 1 % IJ SOLN
INTRAMUSCULAR | Status: AC
  Filled 2016-06-16: qty 2

## 2016-06-16 MED ORDER — CEFTRIAXONE SODIUM 1 G IJ SOLR
1.0000 g | Freq: Once | INTRAMUSCULAR | Status: AC
  Administered 2016-06-16: 1 g via INTRAMUSCULAR

## 2016-06-16 MED ORDER — SULFAMETHOXAZOLE-TRIMETHOPRIM 800-160 MG PO TABS: 1.0000 | ORAL_TABLET | Freq: Two times a day (BID) | ORAL | 0 refills | Status: AC

## 2016-06-16 MED ORDER — SULFAMETHOXAZOLE-TRIMETHOPRIM 800-160 MG PO TABS: 1.0000 | ORAL_TABLET | Freq: Two times a day (BID) | ORAL | 0 refills | Status: DC

## 2016-06-16 NOTE — Discharge Instructions (Signed)
Cellulitis of the hand can be serious. If the antibiotics are not working in the next 36-48 hours and getting better and you develop increased redness, swelling, red streaks, fever, chills, vomiting and feeling sick this could indicate a condition called sepsis which is life threatening and you should go to the emergency department. Additional consequences could be infection in the joints of the hand and fingers which can cause loss of use of the digits or hand and possibly amputation if not treated promptly. Apply warm compresses to the hand frequently during the day.

## 2016-06-16 NOTE — ED Provider Notes (Signed)
CSN: [id number]     Arrival date & time 06/16/16  1804 History   First MD Initiated Contact with Patient 06/16/16 1915     Chief Complaint  Patient presents with  . Insect Bite   (Consider location/radiation/quality/duration/timing/severity/associated sxs/prior Treatment) 25 year old male is accompanied by her significant other's with complaints of pain and redness to the dorsum of the right hand. He states symptoms began yesterday. Initially states he thought it might of been a insect bite or spider bite however he does not have any other injury, how or why he would have erythema to the back of his hand. After asking additional questions and further examination and telling the patient that I could not be sure what the reason for this particular lesion was coming from his significant other and injected the fact that this was likely due to and it failed attempt of the drug use that occurred approximate 5 days ago.      History reviewed. No pertinent past medical history. Past Surgical History:  Procedure Laterality Date  . FOOT SURGERY Left   . INCISION AND DRAINAGE ABSCESS Left    Hand   History reviewed. No pertinent family history. Social History  Substance Use Topics  . Smoking status: Current Some Day Smoker    Types: Cigarettes  . Smokeless tobacco: Never Used  . Alcohol use Yes     Comment: not regularly    Review of Systems  Constitutional: Negative.  Negative for chills and fever.  HENT: Negative.   Respiratory: Negative.   Gastrointestinal: Negative.   Genitourinary: Negative.   Skin: Positive for color change. Negative for rash and wound.  Neurological: Negative.   All other systems reviewed and are negative.   Allergies  Bee venom  Home Medications   Prior to Admission medications   Medication Sig Start Date End Date Taking? Authorizing Provider  cephALEXin (KEFLEX) 500 MG capsule Take 1 capsule (500 mg total) by mouth 4 (four) times daily. 06/16/16    Hayden Rasmussenavid Trinidee Schrag, NP  sulfamethoxazole-trimethoprim (BACTRIM DS,SEPTRA DS) 800-160 MG tablet Take 1 tablet by mouth 2 (two) times daily. 06/16/16 06/23/16  Hayden Rasmussenavid Eashan Schipani, NP   Meds Ordered and Administered this Visit   Medications  cefTRIAXone (ROCEPHIN) injection 1 g (not administered)    BP 154/91 (BP Location: Left Arm)   Pulse 77   Temp 99 F (37.2 C) (Oral)   Resp 18   SpO2 98%  No data found.   Physical Exam  Constitutional: He is oriented to person, place, and time. He appears well-developed and well-nourished. No distress.  HENT:  Head: Normocephalic and atraumatic.  Eyes: EOM are normal.  Neck: Normal range of motion. Neck supple.  Cardiovascular: Normal rate.   Pulmonary/Chest: Effort normal.  Musculoskeletal: He exhibits edema and tenderness.  There is a central round red raised tender lesion to the dorsum of the right hand. Erythema extends to the digits. Does not extend into the forearm and there is no lymphangitis. No evidence of abscess formation. No open areas scabbed areas or other lesions suggestive of a bite or puncture wound that can be seen today.  Neurological: He is alert and oriented to person, place, and time.  Skin: No rash noted.  Psychiatric: His affect is blunt. His speech is delayed. He is withdrawn.  Nursing note and vitals reviewed.   Urgent Care Course   Clinical Course    Procedures (including critical care time)  Labs Review Labs Reviewed - No data to display  Imaging  Review No results found.   Visual Acuity Review  Right Eye Distance:   Left Eye Distance:   Bilateral Distance:    Right Eye Near:   Left Eye Near:    Bilateral Near:         MDM   1. Cellulitis of right upper extremity    Cellulitis of the hand can be serious. If the antibiotics are not working in the next 36-48 hours and getting better and you develop increased redness, swelling, red streaks, fever, chills, vomiting and feeling sick this could indicate a  condition called sepsis which is life threatening and you should go to the emergency department. Additional consequences could be infection in the joints of the hand and fingers which can cause loss of use of the digits or hand and possibly amputation if not treated promptly. Apply warm compresses to the hand frequently during the day. Meds ordered this encounter  Medications  . cefTRIAXone (ROCEPHIN) injection 1 g  . DISCONTD: cephALEXin (KEFLEX) 500 MG capsule    Sig: Take 1 capsule (500 mg total) by mouth 4 (four) times daily.    Dispense:  28 capsule    Refill:  0    Order Specific Question:   Supervising Provider    Answer:   Eustace MooreMURRAY, LAURA W [[id number]]  . DISCONTD: sulfamethoxazole-trimethoprim (BACTRIM DS,SEPTRA DS) 800-160 MG tablet    Sig: Take 1 tablet by mouth 2 (two) times daily.    Dispense:  14 tablet    Refill:  0    Order Specific Question:   Supervising Provider    Answer:   Eustace MooreMURRAY, LAURA W [[id number]]  . cephALEXin (KEFLEX) 500 MG capsule    Sig: Take 1 capsule (500 mg total) by mouth 4 (four) times daily.    Dispense:  28 capsule    Refill:  0    Order Specific Question:   Supervising Provider    Answer:   Eustace MooreMURRAY, LAURA W [[id number]]  . sulfamethoxazole-trimethoprim (BACTRIM DS,SEPTRA DS) 800-160 MG tablet    Sig: Take 1 tablet by mouth 2 (two) times daily.    Dispense:  14 tablet    Refill:  0    Order Specific Question:   Supervising Provider    Answer:   Eustace MooreMURRAY, LAURA W [[id number]]       Hayden Rasmussenavid Lakynn Halvorsen, NP 06/16/16 1942

## 2016-06-16 NOTE — ED Triage Notes (Signed)
[facility] with a complaint of pain and selling to his right hand that started yesterday. The patient reported that he could have been bitten by an insect sometime yesterday. The patient's right hand was swollen and red.

## 2018-01-24 ENCOUNTER — Other Ambulatory Visit: Payer: Self-pay

## 2018-01-24 ENCOUNTER — Other Ambulatory Visit: Payer: Self-pay | Admitting: Family Medicine

## 2018-01-24 DIAGNOSIS — R748 Abnormal levels of other serum enzymes: Secondary | ICD-10-CM

## 2019-09-11 ENCOUNTER — Inpatient Hospital Stay (HOSPITAL_COMMUNITY)
Admission: EM | Admit: 2019-09-11 | Discharge: 2019-09-14 | DRG: 392 | Disposition: A | Discharge status: Home / Self Care | Payer: Commercial Managed Care - PPO | Attending: Family Medicine | Admitting: Family Medicine

## 2019-09-11 ENCOUNTER — Other Ambulatory Visit: Payer: Self-pay

## 2019-09-11 ENCOUNTER — Emergency Department (HOSPITAL_COMMUNITY): Payer: Commercial Managed Care - PPO

## 2019-09-11 DIAGNOSIS — Z6841 Body Mass Index (BMI) 40.0 and over, adult: Secondary | ICD-10-CM

## 2019-09-11 DIAGNOSIS — Z91030 Bee allergy status: Secondary | ICD-10-CM

## 2019-09-11 DIAGNOSIS — K5792 Diverticulitis of intestine, part unspecified, without perforation or abscess without bleeding: Secondary | ICD-10-CM

## 2019-09-11 DIAGNOSIS — R160 Hepatomegaly, not elsewhere classified: Secondary | ICD-10-CM | POA: Diagnosis present

## 2019-09-11 DIAGNOSIS — K5720 Diverticulitis of large intestine with perforation and abscess without bleeding: Principal | ICD-10-CM | POA: Diagnosis present

## 2019-09-11 DIAGNOSIS — K5780 Diverticulitis of intestine, part unspecified, with perforation and abscess without bleeding: Secondary | ICD-10-CM | POA: Diagnosis present

## 2019-09-11 DIAGNOSIS — Z8249 Family history of ischemic heart disease and other diseases of the circulatory system: Secondary | ICD-10-CM

## 2019-09-11 DIAGNOSIS — F17210 Nicotine dependence, cigarettes, uncomplicated: Secondary | ICD-10-CM | POA: Diagnosis present

## 2019-09-11 DIAGNOSIS — F1290 Cannabis use, unspecified, uncomplicated: Secondary | ICD-10-CM | POA: Diagnosis present

## 2019-09-11 DIAGNOSIS — I10 Essential (primary) hypertension: Secondary | ICD-10-CM | POA: Diagnosis present

## 2019-09-11 DIAGNOSIS — R1030 Lower abdominal pain, unspecified: Secondary | ICD-10-CM | POA: Diagnosis not present

## 2019-09-11 DIAGNOSIS — Z20822 Contact with and (suspected) exposure to covid-19: Secondary | ICD-10-CM | POA: Diagnosis present

## 2019-09-11 HISTORY — DX: Elevated blood-pressure reading, without diagnosis of hypertension: R03.0

## 2019-09-11 LAB — COMPREHENSIVE METABOLIC PANEL
ALT: 39 U/L (ref 0–44)
AST: 29 U/L (ref 15–41)
Albumin: 3.6 g/dL (ref 3.5–5.0)
Alkaline Phosphatase: 40 U/L (ref 38–126)
Anion gap: 12 (ref 5–15)
BUN: 14 mg/dL (ref 6–20)
CO2: 20 mmol/L — ABNORMAL LOW (ref 22–32)
Calcium: 8.9 mg/dL (ref 8.9–10.3)
Chloride: 102 mmol/L (ref 98–111)
Creatinine, Ser: 0.91 mg/dL (ref 0.61–1.24)
GFR calc Af Amer: >60 mL/min (ref >60)
GFR calc non Af Amer: >60 mL/min (ref >60)
Glucose, Bld: 110 mg/dL — ABNORMAL HIGH (ref 70–99)
Potassium: 3.4 mmol/L — ABNORMAL LOW (ref 3.5–5.1)
Sodium: 134 mmol/L — ABNORMAL LOW (ref 135–145)
Total Bilirubin: 0.4 mg/dL (ref 0.3–1.2)
Total Protein: 7.7 g/dL (ref 6.5–8.1)

## 2019-09-11 LAB — URINALYSIS, ROUTINE W REFLEX MICROSCOPIC
Bilirubin Urine: NEGATIVE
Glucose, UA: NEGATIVE mg/dL
Hgb urine dipstick: NEGATIVE
Ketones, ur: 20 mg/dL — AB
Leukocytes,Ua: NEGATIVE
Nitrite: NEGATIVE
Protein, ur: 30 mg/dL — AB
Specific Gravity, Urine: 1.027 (ref 1.005–1.030)
pH: 5 (ref 5.0–8.0)

## 2019-09-11 LAB — CBC WITH DIFFERENTIAL/PLATELET
Abs Immature Granulocytes: 0.07 10*3/uL (ref 0.00–0.07)
Basophils Absolute: 0 10*3/uL (ref 0.0–0.1)
Basophils Relative: 0 %
Eosinophils Absolute: 0.9 10*3/uL — ABNORMAL HIGH (ref 0.0–0.5)
Eosinophils Relative: 8 %
HCT: 45 % (ref 39.0–52.0)
Hemoglobin: 14.4 g/dL (ref 13.0–17.0)
Immature Granulocytes: 1 %
Lymphocytes Relative: 19 %
Lymphs Abs: 2.2 10*3/uL (ref 0.7–4.0)
MCH: 28.6 pg (ref 26.0–34.0)
MCHC: 32 g/dL (ref 30.0–36.0)
MCV: 89.3 fL (ref 80.0–100.0)
Monocytes Absolute: 1.1 10*3/uL — ABNORMAL HIGH (ref 0.1–1.0)
Monocytes Relative: 10 %
Neutro Abs: 7.1 10*3/uL (ref 1.7–7.7)
Neutrophils Relative %: 62 %
Platelets: ADEQUATE 10*3/uL (ref 150–400)
RBC: 5.04 MIL/uL (ref 4.22–5.81)
RDW: 14 % (ref 11.5–15.5)
WBC: 11.3 10*3/uL — ABNORMAL HIGH (ref 4.0–10.5)
nRBC: 0 % (ref 0.0–0.2)

## 2019-09-11 LAB — LIPASE, BLOOD: Lipase: 22 U/L (ref 11–51)

## 2019-09-11 MED ORDER — ONDANSETRON HCL 4 MG/2ML IJ SOLN
4.0000 mg | Freq: Once | INTRAMUSCULAR | Status: AC
  Administered 2019-09-11: 4 mg via INTRAVENOUS
  Filled 2019-09-11: qty 2

## 2019-09-11 MED ORDER — LACTATED RINGERS IV BOLUS
1000.0000 mL | Freq: Once | INTRAVENOUS | Status: AC
  Administered 2019-09-11: 1000 mL via INTRAVENOUS

## 2019-09-11 MED ORDER — IOHEXOL 300 MG/ML  SOLN
100.0000 mL | Freq: Once | INTRAMUSCULAR | Status: AC | PRN
  Administered 2019-09-11: 100 mL via INTRAVENOUS

## 2019-09-11 MED ORDER — FENTANYL CITRATE (PF) 100 MCG/2ML IJ SOLN
50.0000 ug | Freq: Once | INTRAMUSCULAR | Status: AC
  Administered 2019-09-11: 50 ug via INTRAVENOUS
  Filled 2019-09-11: qty 2

## 2019-09-11 NOTE — ED Provider Notes (Signed)
[facility] EMERGENCY DEPARTMENT Provider Note   CSN: [id number] Arrival date & time: 09/11/19  1836     History No chief complaint on file.   David Beltran is a 29 y.o. male.  Patient c/o three day history abdominal pain. States started 3 days ago with mid to lower abd pain, acute onset, dull, moderate, non radiating. States was associated w fever, and did have episode of nausea/vomiting and diarrhea. No bloody emesis or bloody diarrhea. States fever persists, but improved, and pain persists, now in lower abd esp right. No hx same symptoms in past. Denies dysuria, or hematuria. No back or flank pain. No known bad food ingestion or ill contacts. Went to urgent care and was sent to ED with concern of possible appendicitis.   The history is provided by the patient.       No past medical history on file.  Patient Active Problem List   Diagnosis Date Noted  . IV drug abuse (HCC)   . Cellulitis of arm, left   . Abscess of left hand 07/03/2014  . Cellulitis 07/03/2014  . Drug abuse (HCC) 07/03/2014    Past Surgical History:  Procedure Laterality Date  . FOOT SURGERY Left   . INCISION AND DRAINAGE ABSCESS Left    Hand       No family history on file.  Social History   Tobacco Use  . Smoking status: Current Some Day Smoker    Types: Cigarettes  . Smokeless tobacco: Never Used  Substance Use Topics  . Alcohol use: Yes    Comment: not regularly  . Drug use: Yes    Types: IV    Comment: heroin    Home Medications Prior to Admission medications   Medication Sig Start Date End Date Taking? Authorizing Provider  cephALEXin (KEFLEX) 500 MG capsule Take 1 capsule (500 mg total) by mouth 4 (four) times daily. 06/16/16   Hayden Rasmussen, NP    Allergies    Bee venom  Review of Systems   Review of Systems  Constitutional: Positive for fever.  HENT: Negative for sore throat.   Eyes: Negative for redness.  Respiratory: Negative for shortness of  breath.   Cardiovascular: Negative for chest pain.  Gastrointestinal: Positive for abdominal pain, diarrhea and vomiting.  Endocrine: Negative for polyuria.  Genitourinary: Negative for dysuria and flank pain.  Musculoskeletal: Negative for back pain and neck pain.  Skin: Negative for rash.  Neurological: Negative for headaches.  Hematological: Does not bruise/bleed easily.  Psychiatric/Behavioral: Negative for confusion.    Physical Exam Updated Vital Signs BP (!) 144/90 (BP Location: Right Arm)   Pulse 88   Temp 97.7 F (36.5 C) (Oral)   Resp (!) 24   SpO2 96%   Physical Exam Vitals and nursing note reviewed.  Constitutional:      Appearance: Normal appearance. He is well-developed.  HENT:     Head: Atraumatic.     Nose: Nose normal.     Mouth/Throat:     Mouth: Mucous membranes are moist.     Pharynx: Oropharynx is clear.  Eyes:     General: No scleral icterus.    Conjunctiva/sclera: Conjunctivae normal.  Neck:     Trachea: No tracheal deviation.  Cardiovascular:     Rate and Rhythm: Normal rate and regular rhythm.     Pulses: Normal pulses.     Heart sounds: Normal heart sounds. No murmur. No friction rub. No gallop.   Pulmonary:  Effort: Pulmonary effort is normal. No accessory muscle usage or respiratory distress.     Breath sounds: Normal breath sounds.  Abdominal:     General: Bowel sounds are normal. There is no distension.     Palpations: Abdomen is soft. There is no mass.     Tenderness: There is abdominal tenderness. There is no guarding or rebound.     Hernia: No hernia is present.     Comments: Obese. RLQ tenderness, moderate.   Genitourinary:    Comments: No cva tenderness. Musculoskeletal:        General: No swelling.     Cervical back: Normal range of motion and neck supple. No rigidity.  Skin:    General: Skin is warm and dry.     Findings: No rash.  Neurological:     Mental Status: He is alert.     Comments: Alert, speech clear.     Psychiatric:        Mood and Affect: Mood normal.     ED Results / Procedures / Treatments   Labs (all labs ordered are listed, but only abnormal results are displayed) Results for orders placed or performed during the hospital encounter of 09/11/19  Comprehensive metabolic panel  Result Value Ref Range   Sodium 134 (L) 135 - 145 mmol/L   Potassium 3.4 (L) 3.5 - 5.1 mmol/L   Chloride 102 98 - 111 mmol/L   CO2 20 (L) 22 - 32 mmol/L   Glucose, Bld 110 (H) 70 - 99 mg/dL   BUN 14 6 - 20 mg/dL   Creatinine, Ser 0.62 0.61 - 1.24 mg/dL   Calcium 8.9 8.9 - 69.4 mg/dL   Total Protein 7.7 6.5 - 8.1 g/dL   Albumin 3.6 3.5 - 5.0 g/dL   AST 29 15 - 41 U/L   ALT 39 0 - 44 U/L   Alkaline Phosphatase 40 38 - 126 U/L   Total Bilirubin 0.4 0.3 - 1.2 mg/dL   GFR calc non Af Amer >60 >60 mL/min   GFR calc Af Amer >60 >60 mL/min   Anion gap 12 5 - 15  Lipase, blood  Result Value Ref Range   Lipase 22 11 - 51 U/L  CBC with Diff  Result Value Ref Range   WBC 11.3 (H) 4.0 - 10.5 K/uL   RBC 5.04 4.22 - 5.81 MIL/uL   Hemoglobin 14.4 13.0 - 17.0 g/dL   HCT 85.4 62.7 - 03.5 %   MCV 89.3 80.0 - 100.0 fL   MCH 28.6 26.0 - 34.0 pg   MCHC 32.0 30.0 - 36.0 g/dL   RDW 00.9 38.1 - 82.9 %   Platelets  150 - 400 K/uL    PLATELET CLUMPS NOTED ON SMEAR, COUNT APPEARS ADEQUATE   nRBC 0.0 0.0 - 0.2 %   Neutrophils Relative % 62 %   Neutro Abs 7.1 1.7 - 7.7 K/uL   Lymphocytes Relative 19 %   Lymphs Abs 2.2 0.7 - 4.0 K/uL   Monocytes Relative 10 %   Monocytes Absolute 1.1 (H) 0.1 - 1.0 K/uL   Eosinophils Relative 8 %   Eosinophils Absolute 0.9 (H) 0.0 - 0.5 K/uL   Basophils Relative 0 %   Basophils Absolute 0.0 0.0 - 0.1 K/uL   Immature Granulocytes 1 %   Abs Immature Granulocytes 0.07 0.00 - 0.07 K/uL  Urinalysis, Routine w reflex microscopic  Result Value Ref Range   Color, Urine AMBER (A) YELLOW   APPearance CLEAR CLEAR  Specific Gravity, Urine 1.027 1.005 - 1.030   pH 5.0 5.0 - 8.0    Glucose, UA NEGATIVE NEGATIVE mg/dL   Hgb urine dipstick NEGATIVE NEGATIVE   Bilirubin Urine NEGATIVE NEGATIVE   Ketones, ur 20 (A) NEGATIVE mg/dL   Protein, ur 30 (A) NEGATIVE mg/dL   Nitrite NEGATIVE NEGATIVE   Leukocytes,Ua NEGATIVE NEGATIVE   RBC / HPF 0-5 0 - 5 RBC/hpf   WBC, UA 0-5 0 - 5 WBC/hpf   Bacteria, UA RARE (A) NONE SEEN   Squamous Epithelial / LPF 0-5 0 - 5   Mucus PRESENT     EKG None  Radiology No results found.  Procedures Procedures (including critical care time)  Medications Ordered in ED Medications - No data to display  ED Course  I have reviewed the triage vital signs and the nursing notes.  Pertinent labs & imaging results that were available during my care of the patient were reviewed by me and considered in my medical decision making (see chart for details).    MDM Rules/Calculators/A&P                      Labs sent. Imaging ordered.  Reviewed nursing notes and prior charts for additional history.   Labs reviewed/interpreted by me - wbc mildly elevated.   CT pending.      Final Clinical Impression(s) / ED Diagnoses Final diagnoses:  None    Rx / DC Orders ED Discharge Orders    None       Lajean Saver, MD 09/12/19 2244

## 2019-09-11 NOTE — ED Triage Notes (Signed)
Pt sent here from [facility] for r/o appendicitis , pt ha shad n/v /d early in the week with some lower abd pain ,

## 2019-09-12 ENCOUNTER — Encounter (HOSPITAL_COMMUNITY): Payer: Self-pay | Admitting: Internal Medicine

## 2019-09-12 DIAGNOSIS — K5792 Diverticulitis of intestine, part unspecified, without perforation or abscess without bleeding: Secondary | ICD-10-CM | POA: Diagnosis present

## 2019-09-12 DIAGNOSIS — Z91030 Bee allergy status: Secondary | ICD-10-CM | POA: Diagnosis not present

## 2019-09-12 DIAGNOSIS — R160 Hepatomegaly, not elsewhere classified: Secondary | ICD-10-CM | POA: Diagnosis present

## 2019-09-12 DIAGNOSIS — Z8249 Family history of ischemic heart disease and other diseases of the circulatory system: Secondary | ICD-10-CM | POA: Diagnosis not present

## 2019-09-12 DIAGNOSIS — F1290 Cannabis use, unspecified, uncomplicated: Secondary | ICD-10-CM | POA: Diagnosis present

## 2019-09-12 DIAGNOSIS — F17210 Nicotine dependence, cigarettes, uncomplicated: Secondary | ICD-10-CM | POA: Diagnosis present

## 2019-09-12 DIAGNOSIS — Z20822 Contact with and (suspected) exposure to covid-19: Secondary | ICD-10-CM | POA: Diagnosis present

## 2019-09-12 DIAGNOSIS — I10 Essential (primary) hypertension: Secondary | ICD-10-CM | POA: Diagnosis present

## 2019-09-12 DIAGNOSIS — K5720 Diverticulitis of large intestine with perforation and abscess without bleeding: Principal | ICD-10-CM

## 2019-09-12 DIAGNOSIS — Z6841 Body Mass Index (BMI) 40.0 and over, adult: Secondary | ICD-10-CM | POA: Diagnosis not present

## 2019-09-12 DIAGNOSIS — K5780 Diverticulitis of intestine, part unspecified, with perforation and abscess without bleeding: Secondary | ICD-10-CM | POA: Diagnosis present

## 2019-09-12 DIAGNOSIS — R1030 Lower abdominal pain, unspecified: Secondary | ICD-10-CM | POA: Diagnosis present

## 2019-09-12 LAB — HIV ANTIBODY (ROUTINE TESTING W REFLEX): HIV Screen 4th Generation wRfx: NONREACTIVE

## 2019-09-12 LAB — CBC
HCT: 40.8 % (ref 39.0–52.0)
Hemoglobin: 13.2 g/dL (ref 13.0–17.0)
MCH: 28.6 pg (ref 26.0–34.0)
MCHC: 32.4 g/dL (ref 30.0–36.0)
MCV: 88.3 fL (ref 80.0–100.0)
Platelets: 269 10*3/uL (ref 150–400)
RBC: 4.62 MIL/uL (ref 4.22–5.81)
RDW: 14.1 % (ref 11.5–15.5)
WBC: 10 10*3/uL (ref 4.0–10.5)
nRBC: 0 % (ref 0.0–0.2)

## 2019-09-12 LAB — COMPREHENSIVE METABOLIC PANEL
ALT: 37 U/L (ref 0–44)
AST: 25 U/L (ref 15–41)
Albumin: 3.2 g/dL — ABNORMAL LOW (ref 3.5–5.0)
Alkaline Phosphatase: 38 U/L (ref 38–126)
Anion gap: 11 (ref 5–15)
BUN: 12 mg/dL (ref 6–20)
CO2: 23 mmol/L (ref 22–32)
Calcium: 8.9 mg/dL (ref 8.9–10.3)
Chloride: 106 mmol/L (ref 98–111)
Creatinine, Ser: 0.94 mg/dL (ref 0.61–1.24)
GFR calc Af Amer: >60 mL/min (ref >60)
GFR calc non Af Amer: >60 mL/min (ref >60)
Glucose, Bld: 104 mg/dL — ABNORMAL HIGH (ref 70–99)
Potassium: 3.6 mmol/L (ref 3.5–5.1)
Sodium: 140 mmol/L (ref 135–145)
Total Bilirubin: 0.7 mg/dL (ref 0.3–1.2)
Total Protein: 6.8 g/dL (ref 6.5–8.1)

## 2019-09-12 LAB — SARS CORONAVIRUS 2 (TAT 6-24 HRS): SARS Coronavirus 2: NEGATIVE

## 2019-09-12 MED ORDER — PIPERACILLIN-TAZOBACTAM 3.375 G IVPB
3.3750 g | Freq: Three times a day (TID) | INTRAVENOUS | Status: DC
  Administered 2019-09-12 – 2019-09-14 (×8): 3.375 g via INTRAVENOUS
  Filled 2019-09-12 (×12): qty 50

## 2019-09-12 MED ORDER — POTASSIUM CHLORIDE IN NACL 20-0.9 MEQ/L-% IV SOLN
INTRAVENOUS | Status: AC
  Filled 2019-09-12 (×3): qty 1000

## 2019-09-12 MED ORDER — ACETAMINOPHEN 325 MG PO TABS: 650.0000 mg | ORAL_TABLET | Freq: Four times a day (QID) | ORAL | Status: DC | PRN

## 2019-09-12 MED ORDER — ENOXAPARIN SODIUM 40 MG/0.4ML ~~LOC~~ SOLN
40.0000 mg | SUBCUTANEOUS | Status: DC
  Administered 2019-09-12: 40 mg via SUBCUTANEOUS
  Filled 2019-09-12: qty 0.4

## 2019-09-12 MED ORDER — PIPERACILLIN-TAZOBACTAM 3.375 G IVPB 30 MIN
3.3750 g | Freq: Once | INTRAVENOUS | Status: AC
  Administered 2019-09-12: 3.375 g via INTRAVENOUS
  Filled 2019-09-12: qty 50

## 2019-09-12 MED ORDER — HYDROMORPHONE HCL 1 MG/ML IJ SOLN: 0.5000 mg | INTRAMUSCULAR | Status: DC | PRN

## 2019-09-12 MED ORDER — ACETAMINOPHEN 650 MG RE SUPP: 650.0000 mg | Freq: Four times a day (QID) | RECTAL | Status: DC | PRN

## 2019-09-12 MED ORDER — ONDANSETRON HCL 4 MG/2ML IJ SOLN: 4.0000 mg | Freq: Four times a day (QID) | INTRAMUSCULAR | Status: DC | PRN

## 2019-09-12 NOTE — Progress Notes (Signed)
PROGRESS NOTE    [name]  XFG:[id number] DOB: 1991-06-11 DOA: 09/11/2019 PCP: Patient, No Pcp Per   Brief Narrative:   David Beltran  is a 29 y.o. male,  Marijuana use, started with abdominal pain, lower abdomen bilaterally Tuesday.  He was hemodynamically stable upon arrival to the ED.  CT abdomen and pelvis showed perforated diverticulitis with inflammatory changes and scattered free air.  He was admitted to hospital service with IV antibiotics and general surgery was consulted.  Assessment & Plan:   Principal Problem:   Diverticulitis of intestine with perforation   Acute diverticulitis with perforation: Continue Zosyn.  Pain better.  General surgery on board.  No plan for surgery.  Surgery plans to give him n.p.o. and likely start him on clears tomorrow.  DVT prophylaxis: SCD Code Status: Full code Family Communication:  None present at bedside.  Plan of care discussed with patient in length and he verbalized understanding and agreed with it. Disposition Plan: Likely home in next 24 to 48 hours.  Estimated body mass index is 43.6 kg/m as calculated from the following:   Height as of this encounter: 6\' 3"  (1.905 m).   Weight as of this encounter: 158.2 kg.      Nutritional status:               Consultants:   General surgery  Procedures:   None  Antimicrobials:   Zosyn   Subjective: Seen and examined.  Abdominal pain improved.  Hurts only when he bends.  No other complaint.  Objective: Vitals:   09/12/19 0500 09/12/19 0609 09/12/19 0758 09/12/19 1300  BP:  (!) 141/82 134/81 133/82  Pulse:  84 80 70  Resp:  20  16  Temp:  99.3 F (37.4 C)  97.6 F (36.4 C)  TempSrc:    Oral  SpO2:  95%  97%  Weight: (!) 158.2 kg     Height:        Intake/Output Summary (Last 24 hours) at 09/12/2019 1324 Last data filed at 09/12/2019 1324 Gross per 24 hour  Intake 1699.01 ml  Output --  Net 1699.01 ml   Filed Weights   09/11/19 2117 09/12/19  0500  Weight: (!) 149.7 kg (!) 158.2 kg    Examination:  General exam: Appears calm and comfortable  Respiratory system: Clear to auscultation. Respiratory effort normal. Cardiovascular system: S1 & S2 heard, RRR. No JVD, murmurs, rubs, gallops or clicks. No pedal edema. Gastrointestinal system: Abdomen is nondistended, soft and mild to moderate suprapubic tenderness. No organomegaly or masses felt. Normal bowel sounds heard. Central nervous system: Alert and oriented. No focal neurological deficits. Extremities: Symmetric 5 x 5 power. Skin: No rashes, lesions or ulcers Psychiatry: Judgement and insight appear normal. Mood & affect appropriate.    Data Reviewed: I have personally reviewed following labs and imaging studies  CBC: Recent Labs  Lab 09/11/19 1922 09/12/19 0228  WBC 11.3* 10.0  NEUTROABS 7.1  --   HGB 14.4 13.2  HCT 45.0 40.8  MCV 89.3 88.3  PLT PLATELET CLUMPS NOTED ON SMEAR, COUNT APPEARS ADEQUATE 269   Basic Metabolic Panel: Recent Labs  Lab 09/11/19 1922 09/12/19 0228  NA 134* 140  K 3.4* 3.6  CL 102 106  CO2 20* 23  GLUCOSE 110* 104*  BUN 14 12  CREATININE 0.91 0.94  CALCIUM 8.9 8.9   GFR: Estimated Creatinine Clearance: 192 mL/min (by C-G formula based on SCr of 0.94 mg/dL). Liver Function Tests: Recent Labs  Lab 09/11/19 1922 09/12/19 0228  AST 29 25  ALT 39 37  ALKPHOS 40 38  BILITOT 0.4 0.7  PROT 7.7 6.8  ALBUMIN 3.6 3.2*   Recent Labs  Lab 09/11/19 1922  LIPASE 22   No results for input(s): AMMONIA in the last 168 hours. Coagulation Profile: No results for input(s): INR, PROTIME in the last 168 hours. Cardiac Enzymes: No results for input(s): CKTOTAL, CKMB, CKMBINDEX, TROPONINI in the last 168 hours. BNP (last 3 results) No results for input(s): PROBNP in the last 8760 hours. HbA1C: No results for input(s): HGBA1C in the last 72 hours. CBG: No results for input(s): GLUCAP in the last 168 hours. Lipid Profile: No results  for input(s): CHOL, HDL, LDLCALC, TRIG, CHOLHDL, LDLDIRECT in the last 72 hours. Thyroid Function Tests: No results for input(s): TSH, T4TOTAL, FREET4, T3FREE, THYROIDAB in the last 72 hours. Anemia Panel: No results for input(s): VITAMINB12, FOLATE, FERRITIN, TIBC, IRON, RETICCTPCT in the last 72 hours. Sepsis Labs: No results for input(s): PROCALCITON, LATICACIDVEN in the last 168 hours.  Recent Results (from the past 240 hour(s))  Blood culture (routine x 2)     Status: None (Preliminary result)   Collection Time: 09/12/19  1:06 AM   Specimen: BLOOD RIGHT ARM  Result Value Ref Range Status   Specimen Description BLOOD RIGHT ARM  Final   Special Requests   Final    BOTTLES DRAWN AEROBIC AND ANAEROBIC Blood Culture results may not be optimal due to an inadequate volume of blood received in culture bottles   Culture   Final    NO GROWTH < 12 HOURS Performed at [facility], [address] [place], [place] [address]    Report Status PENDING  Incomplete  Blood culture (routine x 2)     Status: None (Preliminary result)   Collection Time: 09/12/19  1:06 AM   Specimen: BLOOD LEFT ARM  Result Value Ref Range Status   Specimen Description BLOOD LEFT ARM  Final   Special Requests   Final    BOTTLES DRAWN AEROBIC AND ANAEROBIC Blood Culture results may not be optimal due to an inadequate volume of blood received in culture bottles   Culture   Final    NO GROWTH < 12 HOURS Performed at [facility], [address], [place], [place] [address]    Report Status PENDING  Incomplete      Radiology Studies: CT Abdomen Pelvis W Contrast  Result Date: 09/12/2019 CLINICAL DATA:  Right lower quadrant abdominal pain. EXAM: CT ABDOMEN AND PELVIS WITH CONTRAST TECHNIQUE: Multidetector CT imaging of the abdomen and pelvis was performed using the standard protocol following bolus administration of intravenous contrast. CONTRAST:  OMNIPAQUE IOHEXOL 300 MG/ML  SOLN COMPARISON:  None.  FINDINGS: Lower chest: Slight heterogeneous pulmonary parenchyma suggesting small airways disease. Hepatobiliary: Enlarged liver spanning 25 cm cranial caudal with severe hepatic steatosis. No evidence of focal lesion. Gallbladder physiologically distended, no calcified stone. No biliary dilatation. Pancreas: No ductal dilatation or inflammation. Spleen: Normal in size spanning 13.2 cm cranial caudal. No focal abnormality. Small splenule anteriorly. Adrenals/Urinary Tract: Normal adrenal glands. No hydronephrosis or perinephric edema. Homogeneous renal enhancement. Urinary bladder is physiologically distended without wall thickening. Mild fat stranding adjacent to the bladder is felt to be related to bowel inflammation. Stomach/Bowel: Colonic wall thickening in the sigmoid colon with scattered diverticula. Inflammation about the mesenteric border of the sigmoid may be centered around an inflamed diverticulum, with linear air extending  into the mesentery of the central and right lower quadrant. The adjacent small bowel loops demonstrate wall thickening. No obvious Meckel's diverticulum. The appendix is well visualized and normal. The terminal ileum is normal. Stomach and proximal small bowel appear normal. Vascular/Lymphatic: Portal vein and mesenteric vessels are patent. Enlarged ileocolic lymph nodes measuring up to 15 mm, likely reactive. The abdominal aorta is normal in caliber. Reproductive: Prostate is unremarkable. Other: Fat stranding and edema involving the central and right abdomen and pelvis. Free air in the central lower abdominal mesentery. Small amount of adjacent free fluid. No loculated abscess or focal fluid collection. Small fat containing umbilical hernia. Musculoskeletal: There are no acute or suspicious osseous abnormalities. IMPRESSION: 1. Inflammatory change, free air in the lower abdomen/pelvis. There is wall thickening of both sigmoid colon with scattered colonic diverticula as well as  regional small bowel loops. Findings favor to represent perforated diverticulitis with inflammatory change and scattered free air extending into the lower abdominopelvic mesentery. There is no drainable fluid collection. Perforated Meckel's diverticulitis not entirely excluded, but is felt less likely. 2. Normal appendix. 3. Hepatomegaly and hepatic steatosis. 4. Heterogeneous pulmonary parenchyma suggesting small airways disease. Critical Value/emergent results were called by telephone at the time of interpretation on 09/12/2019 at 12:28 am to Dr Dayna Barker, who verbally acknowledged these results. Electronically Signed   By: Keith Rake M.D.   On: 09/12/2019 00:30    Scheduled Meds: . enoxaparin (LOVENOX) injection  40 mg Subcutaneous Q24H   Continuous Infusions: . 0.9 % NaCl with KCl 20 mEq / L 100 mL/hr at 09/12/19 0359  . piperacillin-tazobactam (ZOSYN)  IV 3.375 g (09/12/19 0612)     LOS: 0 days   Time spent: 29 minutes   Darliss Cheney, MD [facility]  09/12/2019, 1:24 PM   To contact the attending provider between 7A-7P or the covering provider during after hours 7P-7A, please log into the web site [email or web address].

## 2019-09-12 NOTE — Progress Notes (Signed)
Pharmacy Antibiotic Note  David Beltran is a 29 y.o. male admitted on 09/11/2019 with perforated diverticulitis.  Pharmacy has been consulted for Zosyn dosing.  Plan: Zosyn 3.375g IV q8h (4-hour infusion).  Height: 6\' 3"  (190.5 cm) Weight: (!) 330 lb (149.7 kg) IBW/kg (Calculated) : 84.5  Temp (24hrs), Avg:97.7 F (36.5 C), Min:97.7 F (36.5 C), Max:97.7 F (36.5 C)  Recent Labs  Lab 09/11/19 1922  WBC 11.3*  CREATININE 0.91    Estimated Creatinine Clearance: 192.4 mL/min (by C-G formula based on SCr of 0.91 mg/dL).    Allergies  Allergen Reactions  . Bee Venom Swelling    "Childhood"     Thank you for allowing pharmacy to be a part of this patient's care.  09/13/19, PharmD, BCPS  09/12/2019 1:00 AM

## 2019-09-12 NOTE — H&P (Signed)
TRH H&P    Patient Demographics:    David Beltran, is a 29 y.o. male  MRN: [id number]  DOB - 1991/01/14  Admit Date - 09/11/2019  Referring MD/NP/PA:  Marily Memos  Outpatient Primary MD for the patient is Patient, No Pcp Per  Patient coming from:  home  Chief complaint- abdominal pain   HPI:    Terrell Ostrand  is a 29 y.o. male,  Marijuana use, started with abdominal pain, lower abdomen bilaterally Tuesday. Not sure if had fever. Had diarrhea , loose stool.    Pt denies n/v, brbpr, black stool, cough, cp, palp, sob.   In ED,   T 97.7  P 86 R 22, Bp 218/88 pox 100% RA  WT 149.7kg  CT abd/ pelvis IMPRESSION: 1. Inflammatory change, free air in the lower abdomen/pelvis. There s wall thickening of both sigmoid colon with scattered colonic diverticula as well as regional small bowel loops. Findings favor to represent perforated diverticulitis with inflammatory change and scattered free air extending into the lower abdominopelvic mesentery. There is no drainable fluid collection. Perforated Meckel's diverticulitis not entirely excluded, but is felt less likely. 2. Normal appendix. 3. Hepatomegaly and hepatic steatosis. 4. Heterogeneous pulmonary parenchyma suggesting small airways disease.   Na 134, K 3.4 Bun 14, Creatinine 0.91 Ast 29, Alt 39 Urinalysis negative  Lipase 22 Wbc 11.3, Hgb 14.4, Plt  ????  ED states spoke with surgery and will consult per ED.   Pt will be admitted for diverticulitis w perforation.     Review of systems:    In addition to the HPI above,  No Fever-chills, No Headache, No changes with Vision or hearing, No problems swallowing food or Liquids, No Chest pain, Cough or Shortness of Breath, No Nausea or Vomiting, bowel movements are regular, No Blood in stool or Urine, No dysuria, No new skin rashes or bruises, No new joints pains-aches,  No new weakness,  tingling, numbness in any extremity, No recent weight gain or loss, No polyuria, polydypsia or polyphagia, No significant Mental Stressors.  All other systems reviewed and are negative.    Past History of the following :    Past Medical History:  Diagnosis Date  . Elevated BP without diagnosis of hypertension       Past Surgical History:  Procedure Laterality Date  . FOOT SURGERY Left   . INCISION AND DRAINAGE ABSCESS Left    Hand      Social History:      Social History   Tobacco Use  . Smoking status: Current Some Day Smoker    Types: Cigarettes  . Smokeless tobacco: Never Used  Substance Use Topics  . Alcohol use: Yes    Comment: not regularly       Family History :     Family History  Problem Relation Age of Onset  . Heart attack Mother        Home Medications:   Prior to Admission medications   Medication Sig Start Date End Date Taking? Authorizing Provider  aspirin 325 MG  EC tablet Take 650 mg by mouth as needed for pain or fever.   Yes [provider]     Allergies:     Allergies  Allergen Reactions  . Bee Venom Swelling    "Childhood"     Physical Exam:   Vitals  Blood pressure (!) 157/96, pulse (!) 104, temperature 97.7 F (36.5 C), temperature source Oral, resp. rate (!) 26, height 6\' 3"  (1.905 m), weight (!) 149.7 kg, SpO2 97 %.  1.  General: axoxo3  2. Psychiatric: euthymic  3. Neurologic: Nonfocal  4. HEENMT:  Anicteric, pupils 1.70mm symmetric, direct, consensual intact Neck: no jvd  5. Respiratory : CTAB  6. Cardiovascular : rrr s1, s2, no m/g/r  7. Gastrointestinal:  Abd: soft, nt, nd,+bs  8. Skin:  Ext: no c/c/e, no rash  9.Musculoskeletal:  Good ROM    Data Review:    CBC Recent Labs  Lab 09/11/19 1922  WBC 11.3*  HGB 14.4  HCT 45.0  PLT PLATELET CLUMPS NOTED ON SMEAR, COUNT APPEARS ADEQUATE  MCV 89.3  MCH 28.6  MCHC 32.0  RDW 14.0  LYMPHSABS 2.2  MONOABS 1.1*  EOSABS 0.9*    BASOSABS 0.0   ------------------------------------------------------------------------------------------------------------------  Results for orders placed or performed during the hospital encounter of 09/11/19 (from the past 48 hour(s))  Urinalysis, Routine w reflex microscopic     Status: Abnormal   Collection Time: 09/11/19  7:08 PM  Result Value Ref Range   Color, Urine AMBER (A) YELLOW    Comment: BIOCHEMICALS MAY BE AFFECTED BY COLOR   APPearance CLEAR CLEAR   Specific Gravity, Urine 1.027 1.005 - 1.030   pH 5.0 5.0 - 8.0   Glucose, UA NEGATIVE NEGATIVE mg/dL   Hgb urine dipstick NEGATIVE NEGATIVE   Bilirubin Urine NEGATIVE NEGATIVE   Ketones, ur 20 (A) NEGATIVE mg/dL   Protein, ur 30 (A) NEGATIVE mg/dL   Nitrite NEGATIVE NEGATIVE   Leukocytes,Ua NEGATIVE NEGATIVE   RBC / HPF 0-5 0 - 5 RBC/hpf   WBC, UA 0-5 0 - 5 WBC/hpf   Bacteria, UA RARE (A) NONE SEEN   Squamous Epithelial / LPF 0-5 0 - 5   Mucus PRESENT     Comment: Performed at [facility], [address], [place], [place] [address]  Comprehensive metabolic panel     Status: Abnormal   Collection Time: 09/11/19  7:22 PM  Result Value Ref Range   Sodium 134 (L) 135 - 145 mmol/L   Potassium 3.4 (L) 3.5 - 5.1 mmol/L   Chloride 102 98 - 111 mmol/L   CO2 20 (L) 22 - 32 mmol/L   Glucose, Bld 110 (H) 70 - 99 mg/dL   BUN 14 6 - 20 mg/dL   Creatinine, Ser 0.91 0.61 - 1.24 mg/dL   Calcium 8.9 8.9 - 10.3 mg/dL   Total Protein 7.7 6.5 - 8.1 g/dL   Albumin 3.6 3.5 - 5.0 g/dL   AST 29 15 - 41 U/L   ALT 39 0 - 44 U/L   Alkaline Phosphatase 40 38 - 126 U/L   Total Bilirubin 0.4 0.3 - 1.2 mg/dL   GFR calc non Af Amer >60 >60 mL/min   GFR calc Af Amer >60 >60 mL/min   Anion gap 12 5 - 15    Comment: Performed at [facility], [place] [address] [place], [place] [address]  Lipase, blood     Status: None   Collection Time: 09/11/19  7:22 PM  Result Value Ref Range   Lipase 22 11 - 51 U/L    Comment: Performed at  [facility], [address], [place], [place] [address]  CBC with Diff     Status: Abnormal   Collection Time: 09/11/19  7:22 PM  Result Value Ref Range   WBC 11.3 (H) 4.0 - 10.5 K/uL   RBC 5.04 4.22 - 5.81 MIL/uL   Hemoglobin 14.4 13.0 - 17.0 g/dL   HCT 77.8 24.2 - 35.3 %   MCV 89.3 80.0 - 100.0 fL   MCH 28.6 26.0 - 34.0 pg   MCHC 32.0 30.0 - 36.0 g/dL   RDW 61.4 43.1 - 54.0 %   Platelets  150 - 400 K/uL    PLATELET CLUMPS NOTED ON SMEAR, COUNT APPEARS ADEQUATE   nRBC 0.0 0.0 - 0.2 %   Neutrophils Relative % 62 %   Neutro Abs 7.1 1.7 - 7.7 K/uL   Lymphocytes Relative 19 %   Lymphs Abs 2.2 0.7 - 4.0 K/uL   Monocytes Relative 10 %   Monocytes Absolute 1.1 (H) 0.1 - 1.0 K/uL   Eosinophils Relative 8 %   Eosinophils Absolute 0.9 (H) 0.0 - 0.5 K/uL   Basophils Relative 0 %   Basophils Absolute 0.0 0.0 - 0.1 K/uL   Immature Granulocytes 1 %   Abs Immature Granulocytes 0.07 0.00 - 0.07 K/uL    Comment: Performed at [facility], [address] [place], [place] [address]    Chemistries  Recent Labs  Lab 09/11/19 1922  NA 134*  K 3.4*  CL 102  CO2 20*  GLUCOSE 110*  BUN 14  CREATININE 0.91  CALCIUM 8.9  AST 29  ALT 39  ALKPHOS 40  BILITOT 0.4   ------------------------------------------------------------------------------------------------------------------  ------------------------------------------------------------------------------------------------------------------ GFR: Estimated Creatinine Clearance: 192.4 mL/min (by C-G formula based on SCr of 0.91 mg/dL). Liver Function Tests: Recent Labs  Lab 09/11/19 1922  AST 29  ALT 39  ALKPHOS 40  BILITOT 0.4  PROT 7.7  ALBUMIN 3.6   Recent Labs  Lab 09/11/19 1922  LIPASE 22   No results for input(s): AMMONIA in the last 168 hours. Coagulation Profile: No results for input(s): INR, PROTIME in the last 168 hours. Cardiac Enzymes: No results for input(s): CKTOTAL, CKMB, CKMBINDEX, TROPONINI in  the last 168 hours. BNP (last 3 results) No results for input(s): PROBNP in the last 8760 hours. HbA1C: No results for input(s): HGBA1C in the last 72 hours. CBG: No results for input(s): GLUCAP in the last 168 hours. Lipid Profile: No results for input(s): CHOL, HDL, LDLCALC, TRIG, CHOLHDL, LDLDIRECT in the last 72 hours. Thyroid Function Tests: No results for input(s): TSH, T4TOTAL, FREET4, T3FREE, THYROIDAB in the last 72 hours. Anemia Panel: No results for input(s): VITAMINB12, FOLATE, FERRITIN, TIBC, IRON, RETICCTPCT in the last 72 hours.  --------------------------------------------------------------------------------------------------------------- Urine analysis:    Component Value Date/Time   COLORURINE AMBER (A) 09/11/2019 1908   APPEARANCEUR CLEAR 09/11/2019 1908   LABSPEC 1.027 09/11/2019 1908   PHURINE 5.0 09/11/2019 1908   GLUCOSEU NEGATIVE 09/11/2019 1908   HGBUR NEGATIVE 09/11/2019 1908   BILIRUBINUR NEGATIVE 09/11/2019 1908   KETONESUR 20 (A) 09/11/2019 1908   PROTEINUR 30 (A) 09/11/2019 1908   UROBILINOGEN 0.2 09/11/2013 0910   NITRITE NEGATIVE 09/11/2019 1908   LEUKOCYTESUR NEGATIVE 09/11/2019 1908      Imaging Results:    CT Abdomen Pelvis W Contrast  Result Date: 09/12/2019 CLINICAL DATA:  Right lower quadrant abdominal  pain. EXAM: CT ABDOMEN AND PELVIS WITH CONTRAST TECHNIQUE: Multidetector CT imaging of the abdomen and pelvis was performed using the standard protocol following bolus administration of intravenous contrast. CONTRAST:  OMNIPAQUE IOHEXOL 300 MG/ML  SOLN COMPARISON:  None. FINDINGS: Lower chest: Slight heterogeneous pulmonary parenchyma suggesting small airways disease. Hepatobiliary: Enlarged liver spanning 25 cm cranial caudal with severe hepatic steatosis. No evidence of focal lesion. Gallbladder physiologically distended, no calcified stone. No biliary dilatation. Pancreas: No ductal dilatation or inflammation. Spleen: Normal in size  spanning 13.2 cm cranial caudal. No focal abnormality. Small splenule anteriorly. Adrenals/Urinary Tract: Normal adrenal glands. No hydronephrosis or perinephric edema. Homogeneous renal enhancement. Urinary bladder is physiologically distended without wall thickening. Mild fat stranding adjacent to the bladder is felt to be related to bowel inflammation. Stomach/Bowel: Colonic wall thickening in the sigmoid colon with scattered diverticula. Inflammation about the mesenteric border of the sigmoid may be centered around an inflamed diverticulum, with linear air extending into the mesentery of the central and right lower quadrant. The adjacent small bowel loops demonstrate wall thickening. No obvious Meckel's diverticulum. The appendix is well visualized and normal. The terminal ileum is normal. Stomach and proximal small bowel appear normal. Vascular/Lymphatic: Portal vein and mesenteric vessels are patent. Enlarged ileocolic lymph nodes measuring up to 15 mm, likely reactive. The abdominal aorta is normal in caliber. Reproductive: Prostate is unremarkable. Other: Fat stranding and edema involving the central and right abdomen and pelvis. Free air in the central lower abdominal mesentery. Small amount of adjacent free fluid. No loculated abscess or focal fluid collection. Small fat containing umbilical hernia. Musculoskeletal: There are no acute or suspicious osseous abnormalities. IMPRESSION: 1. Inflammatory change, free air in the lower abdomen/pelvis. There is wall thickening of both sigmoid colon with scattered colonic diverticula as well as regional small bowel loops. Findings favor to represent perforated diverticulitis with inflammatory change and scattered free air extending into the lower abdominopelvic mesentery. There is no drainable fluid collection. Perforated Meckel's diverticulitis not entirely excluded, but is felt less likely. 2. Normal appendix. 3. Hepatomegaly and hepatic steatosis. 4.  Heterogeneous pulmonary parenchyma suggesting small airways disease. Critical Value/emergent results were called by telephone at the time of interpretation on 09/12/2019 at 12:28 am to Dr Clayborne Dana, who verbally acknowledged these results. Electronically Signed   By: Narda Rutherford M.D.   On: 09/12/2019 00:30       Assessment & Plan:    Principal Problem:   Diverticulitis of intestine with perforation  Diverticulitis with perforation NPO Ns iv Tylenol,  Dilaudid 0.5mg  iv q4h prn severe pain Zosyn iv pharmacy to dose Surgery consulted by ED, appreciate input   DVT Prophylaxis-   Lovenox - SCDs   AM Labs Ordered, also please review Full Orders  Family Communication: Admission, patients condition and plan of care including tests being ordered have been discussed with the patient  who indicate understanding and agree with the plan and Code Status.  Code Status:  FULL CODE per patient, pt states that his spouse is aware of admission   Admission status:  Inpatient: Based on patients clinical presentation and evaluation of above clinical data, I have made determination that patient meets Inpatient criteria at this time. Pt has high risk of clinical deterioration due to elevated wbc and low bicarb.   Time spent in minutes : 55 minutes   Pearson Grippe M.D on 09/12/2019 at 1:54 AM

## 2019-09-12 NOTE — Consult Note (Signed)
Reason for Consult: diverticulitis Referring Physician: dr Jimmye Norman is an 29 y.o. male.  HPI: 29 year old male came to the emergency room yesterday because of persistent lower abdominal pain that started earlier in the week.  CT imaging found that he had perforated sigmoid diverticulitis with some reactive small bowel wall thickening and he was admitted for additional medical care.  The patient states that he awoke Tuesday with a stomach bug which he states involved nausea, vomiting and diarrhea.  He had some subjective fevers at home as well.  His nausea and vomiting improved over the next day or 2 but he still had intermittent abdominal pain.  He describes it as a pressure in his lower abdomen.  He denies any prior or similar symptoms.  He states that his mother has had diverticulitis and uncle has IBS.  He denies any family history of colon cancer.  He has never had a colonoscopy.  He normally has 2 bowel movements per day.  He denies any melena or hematochezia.  He denies any stool caliber changes.  He denies any dysuria or hematuria  He states that he has hypertension and is supposed to be on medication-lisinopril but has not been taking it.  He plans to resume it after discharge.  He denies any alcohol use.  He does vape and does occasional marijuana  Past Medical History:  Diagnosis Date  . Elevated BP without diagnosis of hypertension     Past Surgical History:  Procedure Laterality Date  . FOOT SURGERY Left   . INCISION AND DRAINAGE ABSCESS Left    Hand    Family History  Problem Relation Age of Onset  . Heart attack Mother     Social History:  reports that he has been smoking cigarettes. He has never used smokeless tobacco. He reports current alcohol use. He reports current drug use. Drug: IV.  Allergies:  Allergies  Allergen Reactions  . Bee Venom Swelling    "Childhood"    Medications: I have reviewed the patient's current medications.  Results for  orders placed or performed during the hospital encounter of 09/11/19 (from the past 48 hour(s))  Urinalysis, Routine w reflex microscopic     Status: Abnormal   Collection Time: 09/11/19  7:08 PM  Result Value Ref Range   Color, Urine AMBER (A) YELLOW    Comment: BIOCHEMICALS MAY BE AFFECTED BY COLOR   APPearance CLEAR CLEAR   Specific Gravity, Urine 1.027 1.005 - 1.030   pH 5.0 5.0 - 8.0   Glucose, UA NEGATIVE NEGATIVE mg/dL   Hgb urine dipstick NEGATIVE NEGATIVE   Bilirubin Urine NEGATIVE NEGATIVE   Ketones, ur 20 (A) NEGATIVE mg/dL   Protein, ur 30 (A) NEGATIVE mg/dL   Nitrite NEGATIVE NEGATIVE   Leukocytes,Ua NEGATIVE NEGATIVE   RBC / HPF 0-5 0 - 5 RBC/hpf   WBC, UA 0-5 0 - 5 WBC/hpf   Bacteria, UA RARE (A) NONE SEEN   Squamous Epithelial / LPF 0-5 0 - 5   Mucus PRESENT     Comment: Performed at [facility], [address] [place], [place] [address]  Comprehensive metabolic panel     Status: Abnormal   Collection Time: 09/11/19  7:22 PM  Result Value Ref Range   Sodium 134 (L) 135 - 145 mmol/L   Potassium 3.4 (L) 3.5 - 5.1 mmol/L   Chloride 102 98 - 111 mmol/L   CO2 20 (L) 22 - 32 mmol/L   Glucose, Bld  110 (H) 70 - 99 mg/dL   BUN 14 6 - 20 mg/dL   Creatinine, Ser 0.91 0.61 - 1.24 mg/dL   Calcium 8.9 8.9 - 10.3 mg/dL   Total Protein 7.7 6.5 - 8.1 g/dL   Albumin 3.6 3.5 - 5.0 g/dL   AST 29 15 - 41 U/L   ALT 39 0 - 44 U/L   Alkaline Phosphatase 40 38 - 126 U/L   Total Bilirubin 0.4 0.3 - 1.2 mg/dL   GFR calc non Af Amer >60 >60 mL/min   GFR calc Af Amer >60 >60 mL/min   Anion gap 12 5 - 15    Comment: Performed at [facility], [place] [address] [place], [place] [address]  Lipase, blood     Status: None   Collection Time: 09/11/19  7:22 PM  Result Value Ref Range   Lipase 22 11 - 51 U/L    Comment: Performed at [facility] [address] [place], [place] [address]  CBC with Diff     Status: Abnormal   Collection Time: 09/11/19  7:22 PM  Result  Value Ref Range   WBC 11.3 (H) 4.0 - 10.5 K/uL   RBC 5.04 4.22 - 5.81 MIL/uL   Hemoglobin 14.4 13.0 - 17.0 g/dL   HCT 45.0 39.0 - 52.0 %   MCV 89.3 80.0 - 100.0 fL   MCH 28.6 26.0 - 34.0 pg   MCHC 32.0 30.0 - 36.0 g/dL   RDW 14.0 11.5 - 15.5 %   Platelets  150 - 400 K/uL    PLATELET CLUMPS NOTED ON SMEAR, COUNT APPEARS ADEQUATE   nRBC 0.0 0.0 - 0.2 %   Neutrophils Relative % 62 %   Neutro Abs 7.1 1.7 - 7.7 K/uL   Lymphocytes Relative 19 %   Lymphs Abs 2.2 0.7 - 4.0 K/uL   Monocytes Relative 10 %   Monocytes Absolute 1.1 (H) 0.1 - 1.0 K/uL   Eosinophils Relative 8 %   Eosinophils Absolute 0.9 (H) 0.0 - 0.5 K/uL   Basophils Relative 0 %   Basophils Absolute 0.0 0.0 - 0.1 K/uL   Immature Granulocytes 1 %   Abs Immature Granulocytes 0.07 0.00 - 0.07 K/uL    Comment: Performed at [facility] [address], [place], [place] [address]  Blood culture (routine x 2)     Status: None (Preliminary result)   Collection Time: 09/12/19  1:06 AM   Specimen: BLOOD RIGHT ARM  Result Value Ref Range   Specimen Description BLOOD RIGHT ARM    Special Requests      BOTTLES DRAWN AEROBIC AND ANAEROBIC Blood Culture results may not be optimal due to an inadequate volume of blood received in culture bottles   Culture      NO GROWTH < 12 HOURS Performed at [facility] [address], [place], [place] [address]    Report Status PENDING   Blood culture (routine x 2)     Status: None (Preliminary result)   Collection Time: 09/12/19  1:06 AM   Specimen: BLOOD LEFT ARM  Result Value Ref Range   Specimen Description BLOOD LEFT ARM    Special Requests      BOTTLES DRAWN AEROBIC AND ANAEROBIC Blood Culture results may not be optimal due to an inadequate volume of blood received in culture bottles   Culture      NO GROWTH < 12 HOURS Performed at [facility], [place]  [address], [place], [place] [address]    Report Status PENDING   Comprehensive metabolic panel     Status: Abnormal    Collection Time: 09/12/19  2:28 AM  Result Value Ref Range   Sodium 140 135 - 145 mmol/L   Potassium 3.6 3.5 - 5.1 mmol/L   Chloride 106 98 - 111 mmol/L   CO2 23 22 - 32 mmol/L   Glucose, Bld 104 (H) 70 - 99 mg/dL   BUN 12 6 - 20 mg/dL   Creatinine, Ser 0.86 0.61 - 1.24 mg/dL   Calcium 8.9 8.9 - 57.8 mg/dL   Total Protein 6.8 6.5 - 8.1 g/dL   Albumin 3.2 (L) 3.5 - 5.0 g/dL   AST 25 15 - 41 U/L   ALT 37 0 - 44 U/L   Alkaline Phosphatase 38 38 - 126 U/L   Total Bilirubin 0.7 0.3 - 1.2 mg/dL   GFR calc non Af Amer >60 >60 mL/min   GFR calc Af Amer >60 >60 mL/min   Anion gap 11 5 - 15    Comment: Performed at [facility], [address] [place], [place] [address]  CBC     Status: None   Collection Time: 09/12/19  2:28 AM  Result Value Ref Range   WBC 10.0 4.0 - 10.5 K/uL   RBC 4.62 4.22 - 5.81 MIL/uL   Hemoglobin 13.2 13.0 - 17.0 g/dL   HCT 95.2 84.1 - 32.4 %   MCV 88.3 80.0 - 100.0 fL   MCH 28.6 26.0 - 34.0 pg   MCHC 32.4 30.0 - 36.0 g/dL   RDW 40.1 02.7 - 25.3 %   Platelets 269 150 - 400 K/uL   nRBC 0.0 0.0 - 0.2 %    Comment: Performed at [facility], [address], [place], [place] [address]    CT Abdomen Pelvis W Contrast  Result Date: 09/12/2019 CLINICAL DATA:  Right lower quadrant abdominal pain. EXAM: CT ABDOMEN AND PELVIS WITH CONTRAST TECHNIQUE: Multidetector CT imaging of the abdomen and pelvis was performed using the standard protocol following bolus administration of intravenous contrast. CONTRAST:  OMNIPAQUE IOHEXOL 300 MG/ML  SOLN COMPARISON:  None. FINDINGS: Lower chest: Slight heterogeneous pulmonary parenchyma suggesting small airways disease. Hepatobiliary: Enlarged liver spanning 25 cm cranial caudal with severe hepatic steatosis. No evidence of focal lesion. Gallbladder physiologically distended, no calcified stone. No biliary dilatation. Pancreas: No ductal dilatation or inflammation. Spleen: Normal in size spanning 13.2 cm cranial  caudal. No focal abnormality. Small splenule anteriorly. Adrenals/Urinary Tract: Normal adrenal glands. No hydronephrosis or perinephric edema. Homogeneous renal enhancement. Urinary bladder is physiologically distended without wall thickening. Mild fat stranding adjacent to the bladder is felt to be related to bowel inflammation. Stomach/Bowel: Colonic wall thickening in the sigmoid colon with scattered diverticula. Inflammation about the mesenteric border of the sigmoid may be centered around an inflamed diverticulum, with linear air extending into the mesentery of the central and right lower quadrant. The adjacent small bowel loops demonstrate wall thickening. No obvious Meckel's diverticulum. The appendix is well visualized and normal. The terminal ileum is normal. Stomach and proximal small bowel appear normal. Vascular/Lymphatic: Portal vein and mesenteric vessels are patent. Enlarged ileocolic lymph nodes measuring up to 15 mm, likely reactive. The abdominal aorta is normal in caliber. Reproductive: Prostate is unremarkable. Other: Fat stranding and edema involving the central and right abdomen and pelvis. Free air in the central lower abdominal mesentery. Small amount of adjacent free fluid.  No loculated abscess or focal fluid collection. Small fat containing umbilical hernia. Musculoskeletal: There are no acute or suspicious osseous abnormalities. IMPRESSION: 1. Inflammatory change, free air in the lower abdomen/pelvis. There is wall thickening of both sigmoid colon with scattered colonic diverticula as well as regional small bowel loops. Findings favor to represent perforated diverticulitis with inflammatory change and scattered free air extending into the lower abdominopelvic mesentery. There is no drainable fluid collection. Perforated Meckel's diverticulitis not entirely excluded, but is felt less likely. 2. Normal appendix. 3. Hepatomegaly and hepatic steatosis. 4. Heterogeneous pulmonary parenchyma  suggesting small airways disease. Critical Value/emergent results were called by telephone at the time of interpretation on 09/12/2019 at 12:28 am to Dr Clayborne DanaMesner, who verbally acknowledged these results. Electronically Signed   By: Narda RutherfordMelanie  Sanford M.D.   On: 09/12/2019 00:30    Review of Systems  All other systems reviewed and are negative. 12 point ROS negative except for above  Blood pressure (!) 141/82, pulse 84, temperature 99.3 F (37.4 C), resp. rate 20, height 6\' 3"  (1.905 m), weight (!) 158.2 kg, SpO2 95 %. Physical Exam  Vitals reviewed. Constitutional: He is oriented to person, place, and time. He appears well-developed and well-nourished. No distress.  Severe truncal obesity; nad, resting comfortably  HENT:  Head: Normocephalic and atraumatic. Head is without raccoon's eyes, without abrasion and without contusion.  Right Ear: Hearing and external ear normal.  Left Ear: Hearing and external ear normal.  Nose: No nose lacerations.  Lips normal  Eyes: Conjunctivae and EOM are normal. Lids are everted and swept, no foreign bodies found. No scleral icterus.  Neck: No tracheal deviation present. No thyromegaly present.  Cardiovascular: Normal rate, regular rhythm, normal heart sounds and intact distal pulses.  Respiratory: Effort normal and breath sounds normal. No accessory muscle usage or stridor. No apnea. No respiratory distress. He has no wheezes.  GI: Normal appearance. There is abdominal tenderness. There is no rigidity and no guarding.  Severe truncal obesity; some tenderness to deep palpation in lower midline  Musculoskeletal:        General: No tenderness, deformity or edema. Normal range of motion.     Cervical back: Normal range of motion and neck supple.  Lymphadenopathy:    He has no cervical adenopathy.  Neurological: He is alert and oriented to person, place, and time. He exhibits normal muscle tone.  Skin: Skin is warm and dry. No bruising and no rash noted. He is  not diaphoretic. No erythema. No pallor.  Psychiatric: He has a normal mood and affect. His speech is normal and behavior is normal. Judgment and thought content normal.    Assessment/Plan: Sigmoid diverticulitis with microperforation marked reactive small bowel wall thickening Severe obesity Hypertension Marijuana use Hepatomegaly  I personally reviewed the CT scan.  His white blood cell count is normal this morning.  He is resting comfortably.  He is nontoxic.  His abdominal exam is reassuring.  No peritonitis.  Therefore I believe we can do IV antibiotics and bowel rest and no indications for surgery at the present moment  Discussed diverticulitis with the patient.  Discussed the typical hospitalization with the patient.  If we are able to get him through this without surgery which I think is likely recommended outpatient follow-up with gastroenterology to discuss colonoscopy in 6 to 8 weeks  Discussed the importance of weight loss with respect to his hepatomegaly  IV antibiotic Chemical DVT prophylaxis N.p.o. except ice chips, sips of water, sips of  liquids from the floor Hopefully we can start clear liquids sunday morning  Mary Sella. Andrey Campanile, MD, FACS General, Bariatric, & Minimally Invasive Surgery [facility], [place];  Gaynelle Adu 09/12/2019, 7:49 AM

## 2019-09-12 NOTE — ED Provider Notes (Signed)
12:43 AM Assumed care from Dr. Denton Lank, please see their note for full history, physical and decision making until this point. In brief this is a 29 y.o. year old male who presented to the ED tonight with No chief complaint on file.     Pending CT scan to evaluate for appendicitis.  On my evaluation patient describes a suprapubic type pain that radiates to his testicles.  States no actual testicular pain.  States he has been having pain for the last 3 to 4 days.  Did have some vomiting and diarrhea initially with a fever as high as 102.  States has been taken some medications which have helped somewhat but not completely.  Went to a different facility today was sent here for further evaluation. On my exam patient has suprapubic tenderness to palpation no rebound or guarding.  He has mild periumbilical tenderness to palpation as well.  Little bit tachycardic but overall appears well. Pending CT scan, will add on medication such as fentanyl, Zofran and fluids.  Received call from Dr. Marisue Humble discussing CT results.  Patient has what appears to be a perforated diverticulitis without abscess or fluid collection.  Antibiotics added on. Cultures ordered.   Discussed with Dr. Selena Batten for admission, surgery to follow.  Discussed with Dr. Andrey Campanile, surgery will eval and follow. No new recommendations.  Labs, studies and imaging reviewed by myself and considered in medical decision making if ordered. Imaging interpreted by radiology.  Labs Reviewed  COMPREHENSIVE METABOLIC PANEL - Abnormal; Notable for the following components:      Result Value   Sodium 134 (*)    Potassium 3.4 (*)    CO2 20 (*)    Glucose, Bld 110 (*)    All other components within normal limits  CBC WITH DIFFERENTIAL/PLATELET - Abnormal; Notable for the following components:   WBC 11.3 (*)    Monocytes Absolute 1.1 (*)    Eosinophils Absolute 0.9 (*)    All other components within normal limits  URINALYSIS, ROUTINE W REFLEX  MICROSCOPIC - Abnormal; Notable for the following components:   Color, Urine AMBER (*)    Ketones, ur 20 (*)    Protein, ur 30 (*)    Bacteria, UA RARE (*)    All other components within normal limits  CULTURE, BLOOD (ROUTINE X 2)  CULTURE, BLOOD (ROUTINE X 2)  LIPASE, BLOOD    CT Abdomen Pelvis W Contrast  Final Result      No follow-ups on file.    Calise Dunckel, Barbara Cower, MD 09/12/19 [id number]

## 2019-09-13 DIAGNOSIS — I10 Essential (primary) hypertension: Secondary | ICD-10-CM | POA: Diagnosis present

## 2019-09-13 LAB — CBC
HCT: 40.7 % (ref 39.0–52.0)
Hemoglobin: 13.1 g/dL (ref 13.0–17.0)
MCH: 28.2 pg (ref 26.0–34.0)
MCHC: 32.2 g/dL (ref 30.0–36.0)
MCV: 87.7 fL (ref 80.0–100.0)
Platelets: 298 10*3/uL (ref 150–400)
RBC: 4.64 MIL/uL (ref 4.22–5.81)
RDW: 13.6 % (ref 11.5–15.5)
WBC: 8.7 10*3/uL (ref 4.0–10.5)
nRBC: 0 % (ref 0.0–0.2)

## 2019-09-13 LAB — BASIC METABOLIC PANEL
Anion gap: 13 (ref 5–15)
BUN: 11 mg/dL (ref 6–20)
CO2: 23 mmol/L (ref 22–32)
Calcium: 9 mg/dL (ref 8.9–10.3)
Chloride: 104 mmol/L (ref 98–111)
Creatinine, Ser: 0.94 mg/dL (ref 0.61–1.24)
GFR calc Af Amer: >60 mL/min (ref >60)
GFR calc non Af Amer: >60 mL/min (ref >60)
Glucose, Bld: 89 mg/dL (ref 70–99)
Potassium: 3.7 mmol/L (ref 3.5–5.1)
Sodium: 140 mmol/L (ref 135–145)

## 2019-09-13 MED ORDER — AMLODIPINE BESYLATE 5 MG PO TABS
5.0000 mg | ORAL_TABLET | Freq: Every day | ORAL | Status: DC
  Administered 2019-09-13: 5 mg via ORAL
  Filled 2019-09-13 (×2): qty 1

## 2019-09-13 NOTE — Progress Notes (Signed)
Patient ID: David Beltran, male   DOB: 1991/06/09, 29 y.o.   MRN: [id number]   Acute Care Surgery Service Progress Note:    Chief Complaint/Subjective: Pain better. Sore in lower abd when move around. Some intermittent nausea but overall feels better  Objective: Vital signs in last 24 hours: Temp:  [97.6 F (36.4 C)-98 F (36.7 C)] 98 F (36.7 C) (01/24 0433) Pulse Rate:  [64-78] 64 (01/24 0902) Resp:  [16-19] 16 (01/24 0433) BP: (132-160)/(79-94) 132/86 (01/24 0902) SpO2:  [96 %-97 %] 96 % (01/24 0433) Weight:  [159.4 kg] 159.4 kg (01/24 0700) Last BM Date: 09/12/19  Intake/Output from previous day: 01/23 0701 - 01/24 0700 In: 699 [P.O.:120; I.V.:479; IV Piggyback:100] Out: -  Intake/Output this shift: No intake/output data recorded.  Lungs: cta, nonlabored  Cardiovascular: reg  Abd: soft, obese, some TTP inlower abd - a little better than yesterday  Extremities: no edema, +SCDs  Neuro: alert, nonfocal  Lab Results: CBC  Recent Labs    09/12/19 0228 09/13/19 0219  WBC 10.0 8.7  HGB 13.2 13.1  HCT 40.8 40.7  PLT 269 298   BMET Recent Labs    09/12/19 0228 09/13/19 0219  NA 140 140  K 3.6 3.7  CL 106 104  CO2 23 23  GLUCOSE 104* 89  BUN 12 11  CREATININE 0.94 0.94  CALCIUM 8.9 9.0   LFT Hepatic Function Latest Ref Rng & Units 09/12/2019 09/11/2019 06/06/2016  Total Protein 6.5 - 8.1 g/dL 6.8 7.7 7.5  Albumin 3.5 - 5.0 g/dL 3.2(L) 3.6 4.4  AST 15 - 41 U/L 25 29 54(H)  ALT 0 - 44 U/L 37 39 57  Alk Phosphatase 38 - 126 U/L 38 40 37(L)  Total Bilirubin 0.3 - 1.2 mg/dL 0.7 0.4 0.6   PT/INR No results for input(s): LABPROT, INR in the last 72 hours. ABG No results for input(s): PHART, HCO3 in the last 72 hours.  Invalid input(s): PCO2, PO2  Studies/Results:  Anti-infectives: Anti-infectives (From admission, onward)   Start     Dose/Rate Route Frequency Ordered Stop   09/12/19 0600  piperacillin-tazobactam (ZOSYN) IVPB 3.375 g     3.375  g 12.5 mL/hr over 240 Minutes Intravenous Every 8 hours 09/12/19 0059     09/12/19 0030  piperacillin-tazobactam (ZOSYN) IVPB 3.375 g     3.375 g 100 mL/hr over 30 Minutes Intravenous  Once 09/12/19 0028 09/12/19 0158      Medications: Scheduled Meds: . amLODipine  5 mg Oral Daily   Continuous Infusions: . piperacillin-tazobactam (ZOSYN)  IV 3.375 g (09/13/19 1655)   PRN Meds:.acetaminophen **OR** acetaminophen, HYDROmorphone (DILAUDID) injection, ondansetron (ZOFRAN) IV  Assessment/Plan: Patient Active Problem List   Diagnosis Date Noted  . Diverticulitis of intestine with perforation 09/12/2019  . Acute diverticulitis 09/12/2019  . IV drug abuse ([place])   . Cellulitis of arm, left   . Abscess of left hand 07/03/2014  . Cellulitis 07/03/2014  . Drug abuse ([place]) 07/03/2014   Sigmoid diverticulitis with microperforation marked reactive small bowel wall thickening Severe obesity Hypertension Marijuana use Hepatomegaly  Disposition: no fever, wbc nml, less pain. Start Clears. Cont iv abx. Ambulate.    LOS: 1 day    Leighton Ruff. [place] Pulling, MD, FACS General, Bariatric, & Minimally Invasive Surgery [phone] [facility]

## 2019-09-13 NOTE — Progress Notes (Signed)
Ordered pt a lunch tray and education provided on new medication, Norvasc, with pt at bedside Provided pt with handout information as well  Pt verbalized understanding

## 2019-09-13 NOTE — Progress Notes (Signed)
CCMD called RN reporting ST elevations  RN responded to bedside  Pt denies chest pain, shortness of breath or discomfort  Vital signs documented  EKG completed- abnormal  Called MD  MD stated to get a repeat EKG  Repeat EKG completed- normal  Called MD to inform  Education provided to pt and pt girlfriend at bedside  Pt verbalizes understanding and knows to inform staff if any symptoms arise  Also educated pt to call RN if any leads for the telemetry become unattached  Will continue to monitor

## 2019-09-13 NOTE — Progress Notes (Signed)
PROGRESS NOTE    David Beltran  YFV:[id number] DOB: 1991-02-01 DOA: 09/11/2019 PCP: Patient, No Pcp Per   Brief Narrative:   David Beltran  is a 29 y.o. male,  Marijuana use, started with abdominal pain, lower abdomen bilaterally Tuesday.  He was hemodynamically stable upon arrival to the ED.  CT abdomen and pelvis showed perforated diverticulitis with inflammatory changes and scattered free air.  He was admitted to hospital service with IV antibiotics and general surgery was consulted.  Assessment & Plan:   Principal Problem:   Diverticulitis of intestine with perforation Active Problems:   Acute diverticulitis   Acute diverticulitis with perforation: Slightly better.  No nausea.  Abdominal pain continues.  General surgery has advanced him to clears.  Continue antibiotics.  Defer further management to general surgery.  Essential hypertension: Patient's diastolic blood pressure has remained slightly elevated intermittently.  Making a diagnosis of essential hypertension.  Starting on amlodipine 5 mg p.o. daily.  DVT prophylaxis: SCD Code Status: Full code Family Communication:  None present at bedside.  Plan of care discussed with patient in length and he verbalized understanding and agreed with it. Disposition Plan: Likely home tomorrow.  Estimated body mass index is 43.92 kg/m as calculated from the following:   Height as of this encounter: 6\' 3"  (1.905 m).   Weight as of this encounter: 159.4 kg.      Nutritional status:               Consultants:   General surgery  Procedures:   None  Antimicrobials:   Zosyn   Subjective: Seen and examined.  Continues to have mild intermittent lower abdominal pain with no nausea or any diarrhea.  Objective: Vitals:   09/12/19 2039 09/13/19 0433 09/13/19 0700 09/13/19 0902  BP: (!) 160/90 (!) 156/94  132/86  Pulse: 77 72  64  Resp: 19 16    Temp: 97.6 F (36.4 C) 98 F (36.7 C)    TempSrc: Oral Oral      SpO2: 97% 96%    Weight:   (!) 159.4 kg   Height:        Intake/Output Summary (Last 24 hours) at 09/13/2019 1042 Last data filed at 09/13/2019 0300 Gross per 24 hour  Intake 699.01 ml  Output --  Net 699.01 ml   Filed Weights   09/11/19 2117 09/12/19 0500 09/13/19 0700  Weight: (!) 149.7 kg (!) 158.2 kg (!) 159.4 kg    Examination:  General exam: Appears calm and comfortable  Respiratory system: Clear to auscultation. Respiratory effort normal. Cardiovascular system: S1 & S2 heard, RRR. No JVD, murmurs, rubs, gallops or clicks. No pedal edema. Gastrointestinal system: Abdomen is nondistended, soft and suprapubic tenderness. No organomegaly or masses felt. Normal bowel sounds heard. Central nervous system: Alert and oriented. No focal neurological deficits. Extremities: Symmetric 5 x 5 power. Skin: No rashes, lesions or ulcers.  Psychiatry: Judgement and insight appear normal. Mood & affect appropriate.    Data Reviewed: I have personally reviewed following labs and imaging studies  CBC: Recent Labs  Lab 09/11/19 1922 09/12/19 0228 09/13/19 0219  WBC 11.3* 10.0 8.7  NEUTROABS 7.1  --   --   HGB 14.4 13.2 13.1  HCT 45.0 40.8 40.7  MCV 89.3 88.3 87.7  PLT PLATELET CLUMPS NOTED ON SMEAR, COUNT APPEARS ADEQUATE 269 163   Basic Metabolic Panel: Recent Labs  Lab 09/11/19 1922 09/12/19 0228 09/13/19 0219  NA 134* 140 140  K 3.4* 3.6 3.7  CL 102 106 104  CO2 20* 23 23  GLUCOSE 110* 104* 89  BUN 14 12 11   CREATININE 0.91 0.94 0.94  CALCIUM 8.9 8.9 9.0   GFR: Estimated Creatinine Clearance: 192.9 mL/min (by C-G formula based on SCr of 0.94 mg/dL). Liver Function Tests: Recent Labs  Lab 09/11/19 1922 09/12/19 0228  AST 29 25  ALT 39 37  ALKPHOS 40 38  BILITOT 0.4 0.7  PROT 7.7 6.8  ALBUMIN 3.6 3.2*   Recent Labs  Lab 09/11/19 1922  LIPASE 22   No results for input(s): AMMONIA in the last 168 hours. Coagulation Profile: No results for input(s): INR,  PROTIME in the last 168 hours. Cardiac Enzymes: No results for input(s): CKTOTAL, CKMB, CKMBINDEX, TROPONINI in the last 168 hours. BNP (last 3 results) No results for input(s): PROBNP in the last 8760 hours. HbA1C: No results for input(s): HGBA1C in the last 72 hours. CBG: No results for input(s): GLUCAP in the last 168 hours. Lipid Profile: No results for input(s): CHOL, HDL, LDLCALC, TRIG, CHOLHDL, LDLDIRECT in the last 72 hours. Thyroid Function Tests: No results for input(s): TSH, T4TOTAL, FREET4, T3FREE, THYROIDAB in the last 72 hours. Anemia Panel: No results for input(s): VITAMINB12, FOLATE, FERRITIN, TIBC, IRON, RETICCTPCT in the last 72 hours. Sepsis Labs: No results for input(s): PROCALCITON, LATICACIDVEN in the last 168 hours.  Recent Results (from the past 240 hour(s))  Blood culture (routine x 2)     Status: None (Preliminary result)   Collection Time: 09/12/19  1:06 AM   Specimen: BLOOD RIGHT ARM  Result Value Ref Range Status   Specimen Description BLOOD RIGHT ARM  Final   Special Requests   Final    BOTTLES DRAWN AEROBIC AND ANAEROBIC Blood Culture results may not be optimal due to an inadequate volume of blood received in culture bottles   Culture   Final    NO GROWTH < 12 HOURS Performed at [facility], [address], [place], [place]    Report Status PENDING  Incomplete  Blood culture (routine x 2)     Status: None (Preliminary result)   Collection Time: 09/12/19  1:06 AM   Specimen: BLOOD LEFT ARM  Result Value Ref Range Status   Specimen Description BLOOD LEFT ARM  Final   Special Requests   Final    BOTTLES DRAWN AEROBIC AND ANAEROBIC Blood Culture results may not be optimal due to an inadequate volume of blood received in culture bottles   Culture   Final    NO GROWTH < 12 HOURS Performed at [facility], [address] [place], [place]    Report Status PENDING  Incomplete  SARS CORONAVIRUS 2 (TAT 6-24 HRS)  Nasopharyngeal Nasopharyngeal Swab     Status: None   Collection Time: 09/12/19  4:02 AM   Specimen: Nasopharyngeal Swab  Result Value Ref Range Status   SARS Coronavirus 2 NEGATIVE NEGATIVE Final    Comment: (NOTE) SARS-CoV-2 target nucleic acids are NOT DETECTED. The SARS-CoV-2 RNA is generally detectable in upper and lower respiratory specimens during the acute phase of infection. Negative results do not preclude SARS-CoV-2 infection, do not rule out co-infections with other pathogens, and should not be used as the sole basis for treatment or other patient management decisions. Negative results must be combined with clinical observations, patient history, and epidemiological information. The expected result is Negative. Fact Sheet for Patients: 09/14/19 Fact Sheet for Healthcare Providers: HairSlick.no This test is  not yet approved or cleared by the [place] and  has been authorized for detection and/or diagnosis of SARS-CoV-2 by FDA under an Emergency Use Authorization (EUA). This EUA will remain  in effect (meaning this test can be used) for the duration of the COVID-19 declaration under Section 56 4(b)(1) of the Act, 21 U.S.C. section 360bbb-3(b)(1), unless the authorization is terminated or revoked sooner. Performed at [facility], [address], [place], [place] [address]       Radiology Studies: CT Abdomen Pelvis W Contrast  Result Date: 09/12/2019 CLINICAL DATA:  Right lower quadrant abdominal pain. EXAM: CT ABDOMEN AND PELVIS WITH CONTRAST TECHNIQUE: Multidetector CT imaging of the abdomen and pelvis was performed using the standard protocol following bolus administration of intravenous contrast. CONTRAST:  OMNIPAQUE IOHEXOL 300 MG/ML  SOLN COMPARISON:  None. FINDINGS: Lower chest: Slight heterogeneous pulmonary parenchyma suggesting small airways disease. Hepatobiliary: Enlarged liver  spanning 25 cm cranial caudal with severe hepatic steatosis. No evidence of focal lesion. Gallbladder physiologically distended, no calcified stone. No biliary dilatation. Pancreas: No ductal dilatation or inflammation. Spleen: Normal in size spanning 13.2 cm cranial caudal. No focal abnormality. Small splenule anteriorly. Adrenals/Urinary Tract: Normal adrenal glands. No hydronephrosis or perinephric edema. Homogeneous renal enhancement. Urinary bladder is physiologically distended without wall thickening. Mild fat stranding adjacent to the bladder is felt to be related to bowel inflammation. Stomach/Bowel: Colonic wall thickening in the sigmoid colon with scattered diverticula. Inflammation about the mesenteric border of the sigmoid may be centered around an inflamed diverticulum, with linear air extending into the mesentery of the central and right lower quadrant. The adjacent small bowel loops demonstrate wall thickening. No obvious Meckel's diverticulum. The appendix is well visualized and normal. The terminal ileum is normal. Stomach and proximal small bowel appear normal. Vascular/Lymphatic: Portal vein and mesenteric vessels are patent. Enlarged ileocolic lymph nodes measuring up to 15 mm, likely reactive. The abdominal aorta is normal in caliber. Reproductive: Prostate is unremarkable. Other: Fat stranding and edema involving the central and right abdomen and pelvis. Free air in the central lower abdominal mesentery. Small amount of adjacent free fluid. No loculated abscess or focal fluid collection. Small fat containing umbilical hernia. Musculoskeletal: There are no acute or suspicious osseous abnormalities. IMPRESSION: 1. Inflammatory change, free air in the lower abdomen/pelvis. There is wall thickening of both sigmoid colon with scattered colonic diverticula as well as regional small bowel loops. Findings favor to represent perforated diverticulitis with inflammatory change and scattered free air  extending into the lower abdominopelvic mesentery. There is no drainable fluid collection. Perforated Meckel's diverticulitis not entirely excluded, but is felt less likely. 2. Normal appendix. 3. Hepatomegaly and hepatic steatosis. 4. Heterogeneous pulmonary parenchyma suggesting small airways disease. Critical Value/emergent results were called by telephone at the time of interpretation on 09/12/2019 at 12:28 am to Dr Clayborne Dana, who verbally acknowledged these results. Electronically Signed   By: Narda Rutherford M.D.   On: 09/12/2019 00:30    Scheduled Meds: . amLODipine  5 mg Oral Daily   Continuous Infusions: . piperacillin-tazobactam (ZOSYN)  IV 3.375 g (09/13/19 4970)     LOS: 1 day   Time spent: 26 minutes   Hughie Closs, MD [facility]  09/13/2019, 10:42 AM   To contact the attending provider between 7A-7P or the covering provider during after hours 7P-7A, please log into the web site [email or web address].

## 2019-09-14 LAB — BASIC METABOLIC PANEL
Anion gap: 13 (ref 5–15)
BUN: 8 mg/dL (ref 6–20)
CO2: 24 mmol/L (ref 22–32)
Calcium: 9.4 mg/dL (ref 8.9–10.3)
Chloride: 102 mmol/L (ref 98–111)
Creatinine, Ser: 1.05 mg/dL (ref 0.61–1.24)
GFR calc Af Amer: >60 mL/min (ref >60)
GFR calc non Af Amer: >60 mL/min (ref >60)
Glucose, Bld: 100 mg/dL — ABNORMAL HIGH (ref 70–99)
Potassium: 3.9 mmol/L (ref 3.5–5.1)
Sodium: 139 mmol/L (ref 135–145)

## 2019-09-14 LAB — CBC
HCT: 45.5 % (ref 39.0–52.0)
Hemoglobin: 14.5 g/dL (ref 13.0–17.0)
MCH: 28.1 pg (ref 26.0–34.0)
MCHC: 31.9 g/dL (ref 30.0–36.0)
MCV: 88.2 fL (ref 80.0–100.0)
Platelets: 299 10*3/uL (ref 150–400)
RBC: 5.16 MIL/uL (ref 4.22–5.81)
RDW: 13.2 % (ref 11.5–15.5)
WBC: 8.5 10*3/uL (ref 4.0–10.5)
nRBC: 0 % (ref 0.0–0.2)

## 2019-09-14 MED ORDER — AMLODIPINE BESYLATE 10 MG PO TABS: 10.0000 mg | ORAL_TABLET | Freq: Every day | ORAL | 0 refills | Status: DC

## 2019-09-14 MED ORDER — METRONIDAZOLE 500 MG PO TABS: 500.0000 mg | ORAL_TABLET | Freq: Three times a day (TID) | ORAL | 0 refills | Status: AC

## 2019-09-14 MED ORDER — AMLODIPINE BESYLATE 10 MG PO TABS
10.0000 mg | ORAL_TABLET | Freq: Every day | ORAL | Status: DC
  Administered 2019-09-14: 10 mg via ORAL
  Filled 2019-09-14: qty 1

## 2019-09-14 MED ORDER — CIPROFLOXACIN HCL 500 MG PO TABS: 500.0000 mg | ORAL_TABLET | Freq: Two times a day (BID) | ORAL | 0 refills | Status: AC

## 2019-09-14 NOTE — Discharge Summary (Signed)
Physician Discharge Summary  LJ MIYAMOTO RSW:[id number] DOB: [birth year] DOA: 09/11/2019  PCP: Patient, No Pcp Per  Admit date: 09/11/2019 Discharge date: 09/14/2019  Admitted From: Home Disposition: Home  Recommendations for Outpatient Follow-up:  1. Follow up with PCP in 1-2 weeks 2. Follow with general surgery in a week. 3. Follow with GI in 6 weeks 4. Please obtain BMP/CBC in one week 5. Please follow up on the following pending results:  Home Health: None Equipment/Devices: None  Discharge Condition: Stable CODE STATUS: Full code Diet recommendation: Low fiber diet  Subjective: Seen and examined.  No abdominal pain or any other complaint.  Tolerated soft diet.  Wants to go home.  Brief/Interim Summary: David Beltran a28 y.o.male,Marijuana use came to emergency department with abdominal pain and was subsequently diagnosed with acute diverticulitis microperforation based on the CT scan.  Admitted to hospital service.  Started antibiotics.  Seen by general surgery.  They recommended no intervention and continuation of antibiotics.  He was kept n.p.o. initially.  He was then started on clear liquids which was advanced to soft diet which he has tolerated.  Patient has been cleared by general surgery and is doing well without any symptoms or any abdominal tenderness so is going to discharge home in stable condition.  He will be discharged on 8 more days of oral ciprofloxacin and Flagyl.  He will follow with PCP.  He will also follow with general surgery within 1 week and general surgery has made arrangements for him to see GI in 6 weeks for colonoscopy.  Discharge Diagnoses:  Principal Problem:   Diverticulitis of intestine with perforation Active Problems:   Acute diverticulitis   Essential hypertension    Discharge Instructions  Discharge Instructions    Ambulatory referral to Gastroenterology   Complete by: As directed    What is the reason for referral?:  Colonoscopy   Discharge patient   Complete by: As directed    Discharge disposition: 01-Home or Self Care   Discharge patient date: 09/14/2019     Allergies as of 09/14/2019      Reactions   Bee Venom Swelling   "Childhood"      Medication List    STOP taking these medications   aspirin 325 MG EC tablet     TAKE these medications   amLODipine 10 MG tablet Commonly known as: NORVASC Take 1 tablet (10 mg total) by mouth daily. Start taking on: September 15, 2019   ciprofloxacin 500 MG tablet Commonly known as: Cipro Take 1 tablet (500 mg total) by mouth 2 (two) times daily for 8 days.   metroNIDAZOLE 500 MG tablet Commonly known as: Flagyl Take 1 tablet (500 mg total) by mouth 3 (three) times daily for 8 days.      Follow-up [facility], [place]. Call.   Specialty: General Surgery Why: as needed after colonoscopy Contact information: [address] [place] [facility]. Call.   Specialty: Gastroenterology Why: Their office will contact you to schedule an appointment for follow up and to schedule a colonoscopy Contact information: [place] [phone]         Allergies  Allergen Reactions  . Bee Venom Swelling    "Childhood"    Consultations: General surgery   Procedures/Studies: CT Abdomen Pelvis W Contrast  Result Date: 09/12/2019 CLINICAL DATA:  Right lower quadrant abdominal pain. EXAM: CT ABDOMEN  AND PELVIS WITH CONTRAST TECHNIQUE: Multidetector CT imaging of the abdomen and pelvis was performed using the standard protocol following bolus administration of intravenous contrast. CONTRAST:  OMNIPAQUE IOHEXOL 300 MG/ML  SOLN COMPARISON:  None. FINDINGS: Lower chest: Slight heterogeneous pulmonary parenchyma suggesting small airways disease. Hepatobiliary: Enlarged liver spanning 25 cm cranial caudal  with severe hepatic steatosis. No evidence of focal lesion. Gallbladder physiologically distended, no calcified stone. No biliary dilatation. Pancreas: No ductal dilatation or inflammation. Spleen: Normal in size spanning 13.2 cm cranial caudal. No focal abnormality. Small splenule anteriorly. Adrenals/Urinary Tract: Normal adrenal glands. No hydronephrosis or perinephric edema. Homogeneous renal enhancement. Urinary bladder is physiologically distended without wall thickening. Mild fat stranding adjacent to the bladder is felt to be related to bowel inflammation. Stomach/Bowel: Colonic wall thickening in the sigmoid colon with scattered diverticula. Inflammation about the mesenteric border of the sigmoid may be centered around an inflamed diverticulum, with linear air extending into the mesentery of the central and right lower quadrant. The adjacent small bowel loops demonstrate wall thickening. No obvious Meckel's diverticulum. The appendix is well visualized and normal. The terminal ileum is normal. Stomach and proximal small bowel appear normal. Vascular/Lymphatic: Portal vein and mesenteric vessels are patent. Enlarged ileocolic lymph nodes measuring up to 15 mm, likely reactive. The abdominal aorta is normal in caliber. Reproductive: Prostate is unremarkable. Other: Fat stranding and edema involving the central and right abdomen and pelvis. Free air in the central lower abdominal mesentery. Small amount of adjacent free fluid. No loculated abscess or focal fluid collection. Small fat containing umbilical hernia. Musculoskeletal: There are no acute or suspicious osseous abnormalities. IMPRESSION: 1. Inflammatory change, free air in the lower abdomen/pelvis. There is wall thickening of both sigmoid colon with scattered colonic diverticula as well as regional small bowel loops. Findings favor to represent perforated diverticulitis with inflammatory change and scattered free air extending into the lower  abdominopelvic mesentery. There is no drainable fluid collection. Perforated Meckel's diverticulitis not entirely excluded, but is felt less likely. 2. Normal appendix. 3. Hepatomegaly and hepatic steatosis. 4. Heterogeneous pulmonary parenchyma suggesting small airways disease. Critical Value/emergent results were called by telephone at the time of interpretation on 09/12/2019 at 12:28 am to Dr Clayborne Dana, who verbally acknowledged these results. Electronically Signed   By: Narda Rutherford M.D.   On: 09/12/2019 00:30      Discharge Exam: Vitals:   09/14/19 0502 09/14/19 1216  BP: (!) 151/90 (!) 155/84  Pulse: 63 93  Resp: 16 16  Temp: (!) 97.4 F (36.3 C) 97.9 F (36.6 C)  SpO2: 96% 95%   Vitals:   09/13/19 1702 09/13/19 2020 09/14/19 0502 09/14/19 1216  BP: (!) 162/101 (!) 152/99 (!) 151/90 (!) 155/84  Pulse: 73 75 63 93  Resp: 18 18 16 16   Temp: 98.6 F (37 C) 98 F (36.7 C) (!) 97.4 F (36.3 C) 97.9 F (36.6 C)  TempSrc: Oral Oral Oral Oral  SpO2: 97% 96% 96% 95%  Weight:   (!) 154.8 kg   Height:        General: Pt is alert, awake, not in acute distress Cardiovascular: RRR, S1/S2 +, no rubs, no gallops Respiratory: CTA bilaterally, no wheezing, no rhonchi Abdominal: Soft, NT, ND, bowel sounds + Extremities: no edema, no cyanosis    The results of significant diagnostics from this hospitalization (including imaging, microbiology, ancillary and laboratory) are listed below for reference.     Microbiology: Recent Results (from the past 240 hour(s))  Blood  culture (routine x 2)     Status: None (Preliminary result)   Collection Time: 09/12/19  1:06 AM   Specimen: BLOOD RIGHT ARM  Result Value Ref Range Status   Specimen Description BLOOD RIGHT ARM  Final   Special Requests   Final    BOTTLES DRAWN AEROBIC AND ANAEROBIC Blood Culture results may not be optimal due to an inadequate volume of blood received in culture bottles   Culture   Final    NO GROWTH 2  DAYS Performed at [facility], [address], [place], [place] [address]    Report Status PENDING  Incomplete  Blood culture (routine x 2)     Status: None (Preliminary result)   Collection Time: 09/12/19  1:06 AM   Specimen: BLOOD LEFT ARM  Result Value Ref Range Status   Specimen Description BLOOD LEFT ARM  Final   Special Requests   Final    BOTTLES DRAWN AEROBIC AND ANAEROBIC Blood Culture results may not be optimal due to an inadequate volume of blood received in culture bottles   Culture   Final    NO GROWTH 2 DAYS Performed at [facility], [address], [place], [place] [address]    Report Status PENDING  Incomplete  SARS CORONAVIRUS 2 (TAT 6-24 HRS) Nasopharyngeal Nasopharyngeal Swab     Status: None   Collection Time: 09/12/19  4:02 AM   Specimen: Nasopharyngeal Swab  Result Value Ref Range Status   SARS Coronavirus 2 NEGATIVE NEGATIVE Final    Comment: (NOTE) SARS-CoV-2 target nucleic acids are NOT DETECTED. The SARS-CoV-2 RNA is generally detectable in upper and lower respiratory specimens during the acute phase of infection. Negative results do not preclude SARS-CoV-2 infection, do not rule out co-infections with other pathogens, and should not be used as the sole basis for treatment or other patient management decisions. Negative results must be combined with clinical observations, patient history, and epidemiological information. The expected result is Negative. Fact Sheet for Patients: HairSlick.no Fact Sheet for Healthcare Providers: quierodirigir.com This test is not yet approved or cleared by the [place] FDA and  has been authorized for detection and/or diagnosis of SARS-CoV-2 by FDA under an Emergency Use Authorization (EUA). This EUA will remain  in effect (meaning this test can be used) for the duration of the COVID-19 declaration under Section 56 4(b)(1) of the Act, 21  U.S.C. section 360bbb-3(b)(1), unless the authorization is terminated or revoked sooner. Performed at [facility], [address] [place], [place] [address]      Labs: BNP (last 3 results) No results for input(s): BNP in the last 8760 hours. Basic Metabolic Panel: Recent Labs  Lab 09/11/19 1922 09/12/19 0228 09/13/19 0219 09/14/19 0647  NA 134* 140 140 139  K 3.4* 3.6 3.7 3.9  CL 102 106 104 102  CO2 20* 23 23 24   GLUCOSE 110* 104* 89 100*  BUN 14 12 11 8   CREATININE 0.91 0.94 0.94 1.05  CALCIUM 8.9 8.9 9.0 9.4   Liver Function Tests: Recent Labs  Lab 09/11/19 1922 09/12/19 0228  AST 29 25  ALT 39 37  ALKPHOS 40 38  BILITOT 0.4 0.7  PROT 7.7 6.8  ALBUMIN 3.6 3.2*   Recent Labs  Lab 09/11/19 1922  LIPASE 22   No results for input(s): AMMONIA in the last 168 hours. CBC: Recent Labs  Lab 09/11/19 1922 09/12/19 0228 09/13/19 0219 09/14/19 0647  WBC 11.3* 10.0 8.7 8.5  NEUTROABS 7.1  --   --   --   HGB 14.4 13.2 13.1 14.5  HCT 45.0 40.8 40.7 45.5  MCV 89.3 88.3 87.7 88.2  PLT PLATELET CLUMPS NOTED ON SMEAR, COUNT APPEARS ADEQUATE 269 298 299   Cardiac Enzymes: No results for input(s): CKTOTAL, CKMB, CKMBINDEX, TROPONINI in the last 168 hours. BNP: Invalid input(s): POCBNP CBG: No results for input(s): GLUCAP in the last 168 hours. D-Dimer No results for input(s): DDIMER in the last 72 hours. Hgb A1c No results for input(s): HGBA1C in the last 72 hours. Lipid Profile No results for input(s): CHOL, HDL, LDLCALC, TRIG, CHOLHDL, LDLDIRECT in the last 72 hours. Thyroid function studies No results for input(s): TSH, T4TOTAL, T3FREE, THYROIDAB in the last 72 hours.  Invalid input(s): FREET3 Anemia work up No results for input(s): VITAMINB12, FOLATE, FERRITIN, TIBC, IRON, RETICCTPCT in the last 72 hours. Urinalysis    Component Value Date/Time   COLORURINE AMBER (A) 09/11/2019 1908   APPEARANCEUR CLEAR 09/11/2019 1908   LABSPEC 1.027  09/11/2019 1908   PHURINE 5.0 09/11/2019 1908   GLUCOSEU NEGATIVE 09/11/2019 1908   HGBUR NEGATIVE 09/11/2019 1908   BILIRUBINUR NEGATIVE 09/11/2019 1908   KETONESUR 20 (A) 09/11/2019 1908   PROTEINUR 30 (A) 09/11/2019 1908   UROBILINOGEN 0.2 09/11/2013 0910   NITRITE NEGATIVE 09/11/2019 1908   LEUKOCYTESUR NEGATIVE 09/11/2019 1908   Sepsis Labs Invalid input(s): PROCALCITONIN,  WBC,  LACTICIDVEN Microbiology Recent Results (from the past 240 hour(s))  Blood culture (routine x 2)     Status: None (Preliminary result)   Collection Time: 09/12/19  1:06 AM   Specimen: BLOOD RIGHT ARM  Result Value Ref Range Status   Specimen Description BLOOD RIGHT ARM  Final   Special Requests   Final    BOTTLES DRAWN AEROBIC AND ANAEROBIC Blood Culture results may not be optimal due to an inadequate volume of blood received in culture bottles   Culture   Final    NO GROWTH 2 DAYS Performed at [facility], [address], [place], [place] [address]    Report Status PENDING  Incomplete  Blood culture (routine x 2)     Status: None (Preliminary result)   Collection Time: 09/12/19  1:06 AM   Specimen: BLOOD LEFT ARM  Result Value Ref Range Status   Specimen Description BLOOD LEFT ARM  Final   Special Requests   Final    BOTTLES DRAWN AEROBIC AND ANAEROBIC Blood Culture results may not be optimal due to an inadequate volume of blood received in culture bottles   Culture   Final    NO GROWTH 2 DAYS Performed at [facility], [address], [place], [place] [address]    Report Status PENDING  Incomplete  SARS CORONAVIRUS 2 (TAT 6-24 HRS) Nasopharyngeal Nasopharyngeal Swab     Status: None   Collection Time: 09/12/19  4:02 AM   Specimen: Nasopharyngeal Swab  Result Value Ref Range Status   SARS Coronavirus 2 NEGATIVE NEGATIVE Final    Comment: (NOTE) SARS-CoV-2 target nucleic acids are NOT DETECTED. The SARS-CoV-2 RNA is generally detectable in upper and lower respiratory specimens  during the acute phase of infection. Negative results do not preclude SARS-CoV-2 infection, do not rule out co-infections with other pathogens, and should not be used as the sole basis for treatment or other patient management decisions. Negative results must be combined with clinical observations, patient history, and epidemiological information. The expected result is Negative. Fact Sheet for  Patients: HairSlick.nohttps://www.fda.gov/media/138098/download Fact Sheet for Healthcare Providers: quierodirigir.comhttps://www.fda.gov/media/138095/download This test is not yet approved or cleared by the [place] FDA and  has been authorized for detection and/or diagnosis of SARS-CoV-2 by FDA under an Emergency Use Authorization (EUA). This EUA will remain  in effect (meaning this test can be used) for the duration of the COVID-19 declaration under Section 56 4(b)(1) of the Act, 21 U.S.C. section 360bbb-3(b)(1), unless the authorization is terminated or revoked sooner. Performed at [facility], [address] [place], [place] [id number]      Time coordinating discharge: Over 30 minutes  SIGNED:   Hughie Beltran David Bribiesca, MD  [facility] 09/14/2019, 1:27 PM  If 7PM-7AM, please contact night-coverage [email or web address] Password [email or web address]

## 2019-09-14 NOTE — Plan of Care (Signed)
Pt understanding of discharge instructions  

## 2019-09-14 NOTE — Progress Notes (Signed)
[facility] Progress Note     Subjective: CC-  No complaints. Denies abdominal pain, nausea, vomiting. Tolerating clear liquids without increased abdominal pain. Loose BM yesterday. WBC 8.5, afebrile.  Objective: Vital signs in last 24 hours: Temp:  [97.4 F (36.3 C)-98.9 F (37.2 C)] 97.4 F (36.3 C) (01/25 0502) Pulse Rate:  [60-75] 63 (01/25 0502) Resp:  [16-18] 16 (01/25 0502) BP: (132-162)/(86-101) 151/90 (01/25 0502) SpO2:  [96 %-97 %] 96 % (01/25 0502) Weight:  [154.8 kg] 154.8 kg (01/25 0502) Last BM Date: 09/13/19  Intake/Output from previous day: 01/24 0701 - 01/25 0700 In: 1152.7 [P.O.:880; IV Piggyback:272.7] Out: -  Intake/Output this shift: No intake/output data recorded.  PE: Gen:  Alert, NAD, pleasant HEENT: EOM's intact, pupils equal and round Card:  RRR Pulm:  CTAB, no W/R/R, rate and effort normal Abd: obese, soft, NT/ND, +BS, no HSM Psych: A&Ox3  Skin: no rashes noted, warm and dry  Lab Results:  Recent Labs    09/13/19 0219 09/14/19 0647  WBC 8.7 8.5  HGB 13.1 14.5  HCT 40.7 45.5  PLT 298 299   BMET Recent Labs    09/13/19 0219 09/14/19 0647  NA 140 139  K 3.7 3.9  CL 104 102  CO2 23 24  GLUCOSE 89 100*  BUN 11 8  CREATININE 0.94 1.05  CALCIUM 9.0 9.4   PT/INR No results for input(s): LABPROT, INR in the last 72 hours. CMP     Component Value Date/Time   NA 139 09/14/2019 0647   K 3.9 09/14/2019 0647   CL 102 09/14/2019 0647   CO2 24 09/14/2019 0647   GLUCOSE 100 (H) 09/14/2019 0647   BUN 8 09/14/2019 0647   CREATININE 1.05 09/14/2019 0647   CALCIUM 9.4 09/14/2019 0647   PROT 6.8 09/12/2019 0228   ALBUMIN 3.2 (L) 09/12/2019 0228   AST 25 09/12/2019 0228   ALT 37 09/12/2019 0228   ALKPHOS 38 09/12/2019 0228   BILITOT 0.7 09/12/2019 0228   GFRNONAA >60 09/14/2019 0647   GFRAA >60 09/14/2019 0647   Lipase     Component Value Date/Time   LIPASE 22 09/11/2019 1922       Studies/Results: No  results found.  Anti-infectives: Anti-infectives (From admission, onward)   Start     Dose/Rate Route Frequency Ordered Stop   09/12/19 0600  piperacillin-tazobactam (ZOSYN) IVPB 3.375 g     3.375 g 12.5 mL/hr over 240 Minutes Intravenous Every 8 hours 09/12/19 0059     09/12/19 0030  piperacillin-tazobactam (ZOSYN) IVPB 3.375 g     3.375 g 100 mL/hr over 30 Minutes Intravenous  Once 09/12/19 0028 09/12/19 0158       Assessment/Plan Severe obesity Hypertension Marijuana use Hepatomegaly  Sigmoid diverticulitis with microperforation marked reactive small bowel wall thickening - first bout of diverticulitis, never had a colonoscopy before  ID - zosyn 1/23>>day#3 FEN - IVF, FLD ADAT VTE - SCDs, per primary/ok for chemical DVT prophylaxis from surgical standpoint Foley - none Follow up - GI  Plan - Abdominal pain resolved, WBC normalized, VSS/afebrile. Advance to full liquids and advance as tolerated to soft/low fiber diet. Mobilize. If patient is tolerated diet he is ok for discharge later today vs tomorrow from surgical standpoint. He will need to be discharge with oral antibiotics to complete 10 day course. He will need a colonoscopy in 6-8 weeks; I called [facility] and sent a referral, they will contact him to make an appointment.   LOS:  2 days    [facility] 09/14/2019, 8:47 AM Please see Amion for pager number during day hours 7:00am-4:30pm

## 2019-09-14 NOTE — Discharge Instructions (Signed)
Low-Fiber Eating Plan Fiber is found in fruits, vegetables, whole grains, and beans. Eating a diet low in fiber helps to reduce how often you have bowel movements and how much you produce during a bowel movement. A low-fiber eating plan may help your digestive system heal if:  You have certain conditions, such as Crohn's disease or diverticulitis.  You recently had radiation therapy on your pelvis or bowel.  You recently had intestinal surgery.  You have a new surgical opening in your abdomen (colostomy or ileostomy).  Your intestine is narrowed (stricture). Your health care provider will determine how long you need to stay on this diet. Your health care provider may recommend that you work with a diet and nutrition specialist (dietitian). What are tips for following this plan? General guidelines  Follow recommendations from your dietitian about how much fiber you should have each day.  Most people on this eating plan should try to eat less than 10 grams (g) of fiber each day. Your daily fiber goal is _________________ g.  Take vitamin and mineral supplements as told by your health care provider or dietitian. Chewable or liquid forms are best when on this eating plan. Reading food labels  Check food labels for the amount of dietary fiber.  Choose foods that have less than 2 grams of fiber in one serving. Cooking  Use white flour and other allowed grains for baking and cooking.  Cook meat using methods that keep it tender, such as braising or poaching.  Cook eggs until the yolk is completely solid.  Cook with healthy oils, such as olive oil or canola oil. Meal planning   Eat 5-6 small meals throughout the day instead of 3 large meals.  If you are lactose intolerant: ? Choose low-lactose dairy foods. ? Do not eat dairy foods, if told by your dietitian.  Limit fat and oils to less than 8 teaspoons a day.  Eat small portions of desserts. What foods are allowed? The items  listed below may not be a complete list. Talk with your dietitian about what dietary choices are best for you. Grains All bread and crackers made with white flour. Waffles, pancakes, and French toast. Bagels. Pretzels. Melba toast, zwieback, and matzoh. Cooked and dried cereals that do not contain whole grains, added fiber, seeds, or dried fruit. Cornmeal. Farina. Hot and cold cereals made with refined corn, wheat, rice, or oats. Plain pasta and noodles. White rice. Vegetables Well-cooked or canned vegetables without skin, seeds, or stems. Cooked potatoes without skins. Vegetable juice. Fruits Soft-cooked or canned fruits without skin and seeds. Peeled ripe banana. Applesauce. Fruit juice without pulp. Meats and other protein foods Ground meat. Tender cuts of meat or poultry. Eggs. Fish, seafood, and shellfish. Smooth nut butters. Tofu. Dairy All milk products and drinks. Lactose-free milks, including rice, soy, and almond milks. Yogurt without fruit, nuts, chocolate, or granola mix-ins. Sour cream. Cottage cheese. Cheese. Beverages Decaf coffee. Fruit and vegetable juices or smoothies (in small amounts, with no pulp or skins, and with fruits from allowed list). Sports drinks. Herbal tea. Fats and oils Olive oil, canola oil, sunflower oil, flaxseed oil, and grapeseed oil. Mayonnaise. Cream cheese. Margarine. Butter. Sweets and desserts Plain cakes and cookies. Cream pies and pies made with allowed fruits. Pudding. Custard. Fruit gelatin. Sherbet. Popsicles. Ice cream without nuts. Plain hard candy. Honey. Jelly. Molasses. Syrups, including chocolate syrup. Chocolate. Marshmallows. Gumdrops. Seasoning and other foods Bouillon. Broth. Cream soups made from allowed foods. Strained soup. Casseroles made   with allowed foods. Ketchup. Mild mustard. Mild salad dressings. Plain gravies. Vinegar. Spices in moderation. Salt. Sugar. What foods are not allowed? The items listed below may not be a complete  list. Talk with your dietitian about what dietary choices are best for you. Grains Whole wheat and whole grain breads and crackers. Multigrain breads and crackers. Rye bread. Whole grain or multigrain cereals. Cereals with nuts, raisins, or coconut. Bran. Coarse wheat cereals. Granola. High-fiber cereals. Cornmeal or corn bread. Whole grain pasta. Wild or brown rice. Quinoa. Popcorn. Buckwheat. Wheat germ. Vegetables Potato skins. Raw or undercooked vegetables. All beans and bean sprouts. Cooked greens. Corn. Peas. Cabbage. Beets. Broccoli. Brussels sprouts. Cauliflower. Mushrooms. Onions. Peppers. Parsnips. Okra. Sauerkraut. Fruit Raw or dried fruit. Berries. Fruit juice with pulp. Prune juice. Meats and other protein foods Tough, fibrous meats with gristle. Fatty meat. Poultry with skin. Fried meat, poultry, or fish. Deli or lunch meats. Sausage, bacon, and hot dogs. Nuts and chunky nut butter. Dried peas, beans, and lentils. Dairy Yogurt with fruit, nuts, chocolate, or granola mix-ins. Beverages Caffeinated coffee and teas. Fats and oils Avocado. Coconut. Sweets and desserts Desserts, cookies, or candies that contain nuts or coconut. Dried fruit. Jams and preserves with seeds. Marmalade. Any dessert made with fruits or grains that are not allowed. Seasoning and other foods Corn tortilla chips. Soups made with vegetables or grains that are not allowed. Relish. Horseradish. Pickles. Olives. Summary  Most people on a low-fiber eating plan should eat less than 10 grams of fiber a day. Follow recommendations from your dietitian about how much fiber you should have each day.  Always check food labels to see the dietary fiber content of packaged foods. In general, a low-fiber food will have fewer than 2 grams of fiber per serving.  In general, try to avoid whole grains, raw fruits and vegetables, dried fruit, tough cuts of meat, nuts, and seeds.  Take a vitamin and mineral supplement as told  by your health care provider or dietitian. This information is not intended to replace advice given to you by your health care provider. Make sure you discuss any questions you have with your health care provider. Document Revised: 11/28/2018 Document Reviewed: 10/09/2016 Elsevier Patient Education  2020 Elsevier Inc.    Diverticulitis  Diverticulitis is when small pockets in your large intestine (colon) get infected or swollen. This causes stomach pain and watery poop (diarrhea). These pouches are called diverticula. They form in people who have a condition called diverticulosis. Follow these instructions at home: Medicines  Take over-the-counter and prescription medicines only as told by your doctor. These include: ? Antibiotics. ? Pain medicines. ? Fiber pills. ? Probiotics. ? Stool softeners.  Do not drive or use heavy machinery while taking prescription pain medicine.  If you were prescribed an antibiotic, take it as told. Do not stop taking it even if you feel better. General instructions   Follow a diet as told by your doctor.  When you feel better, your doctor may tell you to change your diet. You may need to eat a lot of fiber. Fiber makes it easier to poop (have bowel movements). Healthy foods with fiber include: ? Berries. ? Beans. ? Lentils. ? Green vegetables.  Exercise 3 or more times a week. Aim for 30 minutes each time. Exercise enough to sweat and make your heart beat faster.  Keep all follow-up visits as told. This is important. You may need to have an exam of the large intestine. This is   called a colonoscopy. Contact a doctor if:  Your pain does not get better.  You have a hard time eating or drinking.  You are not pooping like normal. Get help right away if:  Your pain gets worse.  Your problems do not get better.  Your problems get worse very fast.  You have a fever.  You throw up (vomit) more than one time.  You have poop that  is: ? Bloody. ? Black. ? Tarry. Summary  Diverticulitis is when small pockets in your large intestine (colon) get infected or swollen.  Take medicines only as told by your doctor.  Follow a diet as told by your doctor. This information is not intended to replace advice given to you by your health care provider. Make sure you discuss any questions you have with your health care provider. Document Revised: 07/19/2017 Document Reviewed: 08/23/2016 Elsevier Patient Education  2020 Elsevier Inc.  

## 2019-09-17 LAB — CULTURE, BLOOD (ROUTINE X 2)
Culture: NO GROWTH
Culture: NO GROWTH

## 2019-10-15 ENCOUNTER — Encounter: Payer: Self-pay | Admitting: Nurse Practitioner

## 2019-10-25 NOTE — Progress Notes (Signed)
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  Past surgeries for congenital left club foot. He awakened with a fever and central lower abdominal pain on 09/09/2019. He rested in bed for the next 1 1/2 days. He continued to have a fever and central lower abdominal pain so he presented to the urgent care on 09/11/2019.  He was sent to [facility] emergency room for further evaluation.  An abdominal/pelvic CT with contrast identified acute diverticulitis with a microperforation, wall thickening of both sigmoid colon with scattered colonic diverticula as well as regional small bowel loops. Laboratory studies in the ED showed a WBC 11.3.  Hemoglobin 14.4.  Hematocrit 45.0.  Platelets were clumped.  Sodium 134.  Potassium 3.4.  BUN 14.  Creatinine 0.91.  Alk phos 40.  AST 29.  ALT 39.  Total bili 0.4.  Lipase 22.  Lipase 22.  He was evaluated by general surgery, no need for surgical intervention during his hospitalization.  He received IV Cipro and Flagyl and he was discharged home on 09/14/2019 with Cipro 500 mg twice daily and Flagyl 500 mg 3 times daily to complete a 10-day course.  He was advised to schedule a colonoscopy.  He denies having any further lower abdominal pain.  He is passing 2-3 soft formed brown stools daily.  He denies having constipation issues.  No diarrhea.  No rectal bleeding or melena.  No other complaints today.  No known family history of diverticular disease or colorectal cancer.  His mother died from MI in her late 63s.  Father's history is unknown.    CBC Latest Ref Rng & Units 09/14/2019 09/13/2019 09/12/2019  WBC 4.0 - 10.5 K/uL 8.5 8.7 10.0  Hemoglobin 13.0 - 17.0 g/dL 14.5 13.1 13.2  Hematocrit  39.0 - 52.0 % 45.5 40.7 40.8  Platelets 150 - 400 K/uL 299 298 269   CMP Latest Ref Rng & Units 09/14/2019 09/13/2019 09/12/2019  Glucose 70 - 99 mg/dL 100(H) 89 104(H)  BUN 6 - 20 mg/dL _0 Creatinine 0.61 - 1.24 mg/dL 1.05 0.94 0.94  Sodium 135 - 145 mmol/L 139 140 140  Potassium 3.5 - 5.1 mmol/L 3.9 3.7 3.6  Chloride 98 - 111 mmol/L 102 104 106  CO2 22 - 32 mmol/L _1 Calcium 8.9 - 10.3 mg/dL 9.4 9.0 8.9  Total Protein 6.5 - 8.1 g/dL - - 6.8  Total Bilirubin 0.3 - 1.2 mg/dL - - 0.7  Alkaline Phos 38 - 126 U/L - - 38  AST 15 - 41 U/L - - 25  ALT 0 - 44 U/L - - 37     Abdominal/Pelvic CT with contrast 09/11/2019: 1. Inflammatory change, free air in the lower abdomen/pelvis. There is wall thickening of both sigmoid colon with scattered colonic diverticula as well as regional small bowel loops. Findings favor to represent perforated diverticulitis with inflammatory change and scattered free air extending into the lower abdominopelvic mesentery. There is no drainable fluid collection. Perforated Meckel's diverticulitis not entirely excluded, but is felt less likely. 2. Normal appendix. 3. Hepatomegaly and hepatic steatosis. 4. Heterogeneous pulmonary parenchyma suggesting small airways disease.   Past Medical History:  Diagnosis Date  .  Elevated BP without diagnosis of hypertension    Past Surgical History:  Procedure Laterality Date  . FOOT SURGERY Left   . INCISION AND DRAINAGE ABSCESS Left    Hand    Family History: Mother died MI age 3 or 17. Father's history unknown. 26 Brother age 29 ADHD.   Social History: Marijuana 3 times weekly for the past 5 years. Smoked cigarettes  < 1ppd x 4 years on off, quit 4 years ago. Occasionally does vape.  Past IV heroin use after his mother died, clean for 3 years.  No alcohol.   Allergies  Allergen Reactions  . Bee Venom Swelling    "Childhood"      Outpatient Encounter Medications as of 10/26/2019  Medication  Sig  . amLODipine (NORVASC) 10 MG tablet Take 1 tablet (10 mg total) by mouth daily.   No facility-administered encounter medications on file as of 10/26/2019.     REVIEW OF SYSTEMS: All other systems reviewed and negative except where noted in the History of Present Illness.   PHYSICAL EXAM: BP (!) 144/88   Pulse 91   Temp 98 F (36.7 C)   Ht _0  (1.905 m)   Wt (!) 358 lb (162.4 kg)   SpO2 95%   BMI 44.75 kg/m  General: Obese male in no acute distress. Head: Normocephalic and atraumatic. Eyes:  Sclerae non-icteric, conjunctive pink. Ears: Normal auditory acuity. Mouth: Dentition intact. No ulcers or lesions. Long uvula. Tonsils 3+.  Neck: Supple, no lymphadenopathy or thyromegaly.  Lungs: Clear bilaterally to auscultation without wheezes, crackles or rhonchi. Heart: Regular rate and rhythm. No murmur, rub or gallop appreciated.  Abdomen: Obese abdomen, soft, nontender, non distended. No masses. No hepatosplenomegaly appreciated. Normoactive bowel sounds x 4 quadrants.  Rectal: Deferred.  Musculoskeletal: Symmetrical with no gross deformities. Skin: Warm and dry. No rash or lesions on visible extremities. Extremities: No edema. Neurological: Alert oriented x 4, no focal deficits.  Psychological:  Alert and cooperative. Normal mood and affect.  ASSESSMENT AND PLAN:  27. 29 year old male with sigmoid diverticulitis with microperforation with colon and small bowel wall thickening 09/11/2019 resolved after Cipro and Flagyl x 10 days. No surgical intervention required during his hospitalization. He was seen by general surgery who recommended a colonoscopy as an outpatient. No further abdominal pain.  -Colonoscopy benefits and risks discussed including risk with sedation, risk of bleeding, perforation and infection  -Recommend patient to follow up with [facility] post colonoscopy  -Avoid constipation, drink 8 glasses of water daily. Miralax if needed -Patient to call  our office if his diverticulitis recurs.   2. Hepatomegaly/Hepatic steatosis secondary to obesity -I spoke to the patient regarding his risk of fatty liver disease.  I advised the patient to schedule a follow-up appointment in our office 2 months for fatty liver/hepatomegaly follow-up  3.  Hypertension. he was prescribed Norvasc 10 mg once daily, however, he ran out of this prescription since his hospitalization 08/2019 and he currently does not have a primary care physician -The patient has health insurance, I advised that he obtain a primary care physician to manage his hypertension, obesity in general well health visits  CC:  Meuth, Blaine Hamper, PA-C

## 2019-10-26 ENCOUNTER — Other Ambulatory Visit: Payer: Self-pay

## 2019-10-26 ENCOUNTER — Encounter: Payer: Self-pay | Admitting: Nurse Practitioner

## 2019-10-26 ENCOUNTER — Ambulatory Visit: Payer: Commercial Managed Care - PPO | Admitting: Nurse Practitioner

## 2019-10-26 VITALS — BP 144/88 | HR 91 | Temp 98.0°F | Ht 75.0 in | Wt 358.0 lb

## 2019-10-26 DIAGNOSIS — Z01818 Encounter for other preprocedural examination: Secondary | ICD-10-CM | POA: Diagnosis not present

## 2019-10-26 DIAGNOSIS — K5700 Diverticulitis of small intestine with perforation and abscess without bleeding: Secondary | ICD-10-CM

## 2019-10-26 NOTE — Patient Instructions (Signed)
If you are age 27 or older, your body mass index should be between 23-30. Your Body mass index is 44.75 kg/m. If this is out of the aforementioned range listed, please consider follow up with your Primary Care Provider.  If you are age 61 or younger, your body mass index should be between 19-25. Your Body mass index is 44.75 kg/m. If this is out of the aformentioned range listed, please consider follow up with your Primary Care Provider.   Avoid constipation use Miralax as needed. Drink 8 glasses a water daily.  Due to recent changes in healthcare laws, you may see the results of your imaging and laboratory studies on MyChart before your provider has had a chance to review them.  We understand that in some cases there may be results that are confusing or concerning to you. Not all laboratory results come back in the same time frame and the provider may be waiting for multiple results in order to interpret others.  Please give [place] 48 hours in order for your provider to thoroughly review all the results before contacting the office for clarification of your results.   Thank you for choosing [facility] Arnaldo Natal, CRNP

## 2019-11-26 ENCOUNTER — Ambulatory Visit (INDEPENDENT_AMBULATORY_CARE_PROVIDER_SITE_OTHER): Payer: Commercial Managed Care - PPO

## 2019-11-26 ENCOUNTER — Other Ambulatory Visit: Payer: Self-pay

## 2019-11-26 ENCOUNTER — Other Ambulatory Visit: Payer: Self-pay | Admitting: Internal Medicine

## 2019-11-26 DIAGNOSIS — Z1159 Encounter for screening for other viral diseases: Secondary | ICD-10-CM

## 2019-11-27 LAB — SARS CORONAVIRUS 2 (TAT 6-24 HRS): SARS Coronavirus 2: NEGATIVE

## 2019-11-30 ENCOUNTER — Ambulatory Visit (AMBULATORY_SURGERY_CENTER): Payer: Commercial Managed Care - PPO | Admitting: Internal Medicine

## 2019-11-30 ENCOUNTER — Other Ambulatory Visit: Payer: Self-pay

## 2019-11-30 ENCOUNTER — Encounter: Payer: Self-pay | Admitting: Internal Medicine

## 2019-11-30 VITALS — BP 128/75 | HR 75 | Temp 97.1°F | Resp 28 | Ht 75.0 in | Wt 358.0 lb

## 2019-11-30 DIAGNOSIS — K5720 Diverticulitis of large intestine with perforation and abscess without bleeding: Secondary | ICD-10-CM | POA: Diagnosis present

## 2019-11-30 MED ORDER — SODIUM CHLORIDE 0.9 % IV SOLN: 500.0000 mL | Freq: Once | INTRAVENOUS | Status: DC

## 2019-11-30 NOTE — Progress Notes (Signed)
Report given to PACU, vss 

## 2019-11-30 NOTE — Patient Instructions (Addendum)
No polyps or cancer seen. No active diverticulitis.  You should follow-up with [facility] and also obtain a primary healthcare provider to assist you with your general health needs.  I appreciate the opportunity to care for you. Iva Boop, MD, [facility]    [email or web address]  [email or web address]     YOU HAD AN ENDOSCOPIC PROCEDURE TODAY AT THE [facility]:   Refer to the procedure report that was given to you for any specific questions about what was found during the examination.  If the procedure report does not answer your questions, please call your gastroenterologist to clarify.  If you requested that your care partner not be given the details of your procedure findings, then the procedure report has been included in a sealed envelope for you to review at your convenience later.  YOU SHOULD EXPECT: Some feelings of bloating in the abdomen. Passage of more gas than usual.  Walking can help get rid of the air that was put into your GI tract during the procedure and reduce the bloating. If you had a lower endoscopy (such as a colonoscopy or flexible sigmoidoscopy) you may notice spotting of blood in your stool or on the toilet paper. If you underwent a bowel prep for your procedure, you may not have a normal bowel movement for a few days.  Please Note:  You might notice some irritation and congestion in your nose or some drainage.  This is from the oxygen used during your procedure.  There is no need for concern and it should clear up in a day or so.  SYMPTOMS TO REPORT IMMEDIATELY:   Following lower endoscopy (colonoscopy or flexible sigmoidoscopy):  Excessive amounts of blood in the stool  Significant tenderness or worsening of abdominal pains  Swelling of the abdomen that is new, acute  Fever of 100F or higher   For urgent or emergent issues, a gastroenterologist can be reached at any hour by calling [phone]. Do not use MyChart messaging for urgent  concerns.    DIET:  We do recommend a small meal at first, but then you may proceed to your regular diet.  Drink plenty of fluids but you should avoid alcoholic beverages for 24 hours.  ACTIVITY:  You should plan to take it easy for the rest of today and you should NOT DRIVE or use heavy machinery until tomorrow (because of the sedation medicines used during the test).    FOLLOW UP: Our staff will call the number listed on your records 48-72 hours following your procedure to check on you and address any questions or concerns that you may have regarding the information given to you following your procedure. If we do not reach you, we will leave a message.  We will attempt to reach you two times.  During this call, we will ask if you have developed any symptoms of COVID 19. If you develop any symptoms (ie: fever, flu-like symptoms, shortness of breath, cough etc.) before then, please call [phone].  If you test positive for Covid 19 in the 2 weeks post procedure, please call and report this information to [place].    If any biopsies were taken you will be contacted by phone or by letter within the next 1-3 weeks.  Please call us at [phone] if you have not heard about the biopsies in 3 weeks.    SIGNATURES/CONFIDENTIALITY: You and/or your care partner have signed paperwork which will be entered into your electronic medical record.  These  signatures attest to the fact that that the information above on your After Visit Summary has been reviewed and is understood.  Full responsibility of the confidentiality of this discharge information lies with you and/or your care-partner. 

## 2019-11-30 NOTE — Op Note (Signed)
[facility] Patient Name: David Beltran Procedure Date: 11/30/2019 2:04 PM MRN: [id number] Endoscopist: Iva Boop , MD Age: 29 Referring MD:  Date of Birth: [birth year] Gender: Male Account #: [id number] Procedure:                Colonoscopy Indications:              Follow-up of diverticulitis Medicines:                Propofol per Anesthesia, Monitored Anesthesia Care Procedure:                Pre-Anesthesia Assessment:                           - Prior to the procedure, a History and Physical                            was performed, and patient medications and                            allergies were reviewed. The patient's tolerance of                            previous anesthesia was also reviewed. The risks                            and benefits of the procedure and the sedation                            options and risks were discussed with the patient.                            All questions were answered, and informed consent                            was obtained. Prior Anticoagulants: The patient has                            taken no previous anticoagulant or antiplatelet                            agents. ASA Grade Assessment: III - A patient with                            severe systemic disease. After reviewing the risks                            and benefits, the patient was deemed in                            satisfactory condition to undergo the procedure.                           After obtaining informed consent, the colonoscope  was passed under direct vision. Throughout the                            procedure, the patient's blood pressure, pulse, and                            oxygen saturations were monitored continuously. The                            Colonoscope was introduced through the anus and                            advanced to the the cecum, identified by                            appendiceal  orifice and ileocecal valve. The                            colonoscopy was somewhat difficult due to the                            patient's agitation. Successful completion of the                            procedure was aided by increasing the dose of                            sedation medication. The patient tolerated the                            procedure fairly well. The quality of the bowel                            preparation was adequate. Scope In: 2:16:55 PM Scope Out: 2:30:13 PM Scope Withdrawal Time: 0 hours 8 minutes 3 seconds  Total Procedure Duration: 0 hours 13 minutes 18 seconds  Findings:                 The perianal and digital rectal examinations were                            normal.                           Multiple small-mouthed diverticula were found in                            the sigmoid colon. Some narrowing of lumen. No                            diverticulitis.                           The exam was otherwise without abnormality on  direct and retroflexion views. Complications:            No immediate complications. Estimated Blood Loss:     Estimated blood loss: none. Impression:               - Diverticulosis in the sigmoid colon.                           - The examination was otherwise normal on direct                            and retroflexion views.                           - No specimens collected. Recommendation:           - Patient has a contact number available for                            emergencies. The signs and symptoms of potential                            delayed complications were discussed with the                            patient. Return to normal activities tomorrow.                            Written discharge instructions were provided to the                            patient.                           - Continue present medications.                           - No recommendation at this  time regarding repeat                            colonoscopy due to young age.                           - Healthy diet, one that allows weight loss.                           - patient to schedule follow-up with [facility] and also obtain a PCP Iva Boop, MD 11/30/2019 2:41:45 PM This report has been signed electronically.

## 2019-11-30 NOTE — Progress Notes (Signed)
Temp by LC. VS by DT. 

## 2019-12-02 ENCOUNTER — Telehealth: Payer: Self-pay

## 2019-12-02 ENCOUNTER — Telehealth: Payer: Self-pay | Admitting: *Deleted

## 2019-12-02 NOTE — Telephone Encounter (Signed)
First attempt follow up call to pt, lm on vm 

## 2019-12-02 NOTE — Telephone Encounter (Signed)
  Follow up Call-  Call back number 11/30/2019  Post procedure Call Back phone  # [phone]  Permission to leave phone message Yes  Some recent data might be hidden     Patient questions:  Message left to call us if necessary.

## 2021-04-29 ENCOUNTER — Other Ambulatory Visit: Payer: Self-pay

## 2021-04-29 ENCOUNTER — Emergency Department (HOSPITAL_COMMUNITY): Payer: BC Managed Care – PPO

## 2021-04-29 ENCOUNTER — Emergency Department (HOSPITAL_COMMUNITY)
Admission: EM | Admit: 2021-04-29 | Discharge: 2021-04-29 | Disposition: A | Discharge status: Home / Self Care | Payer: BC Managed Care – PPO | Attending: Emergency Medicine | Admitting: Emergency Medicine

## 2021-04-29 ENCOUNTER — Encounter (HOSPITAL_COMMUNITY): Payer: Self-pay

## 2021-04-29 DIAGNOSIS — S3992XA Unspecified injury of lower back, initial encounter: Secondary | ICD-10-CM | POA: Diagnosis present

## 2021-04-29 DIAGNOSIS — I10 Essential (primary) hypertension: Secondary | ICD-10-CM | POA: Diagnosis not present

## 2021-04-29 DIAGNOSIS — R519 Headache, unspecified: Secondary | ICD-10-CM | POA: Diagnosis not present

## 2021-04-29 DIAGNOSIS — S01511A Laceration without foreign body of lip, initial encounter: Secondary | ICD-10-CM | POA: Diagnosis not present

## 2021-04-29 DIAGNOSIS — S32059A Unspecified fracture of fifth lumbar vertebra, initial encounter for closed fracture: Secondary | ICD-10-CM | POA: Diagnosis not present

## 2021-04-29 DIAGNOSIS — F17210 Nicotine dependence, cigarettes, uncomplicated: Secondary | ICD-10-CM | POA: Diagnosis not present

## 2021-04-29 DIAGNOSIS — R079 Chest pain, unspecified: Secondary | ICD-10-CM | POA: Insufficient documentation

## 2021-04-29 DIAGNOSIS — S40012A Contusion of left shoulder, initial encounter: Secondary | ICD-10-CM | POA: Diagnosis not present

## 2021-04-29 DIAGNOSIS — S301XXA Contusion of abdominal wall, initial encounter: Secondary | ICD-10-CM | POA: Insufficient documentation

## 2021-04-29 DIAGNOSIS — Y92410 Unspecified street and highway as the place of occurrence of the external cause: Secondary | ICD-10-CM | POA: Diagnosis not present

## 2021-04-29 DIAGNOSIS — K5732 Diverticulitis of large intestine without perforation or abscess without bleeding: Secondary | ICD-10-CM | POA: Diagnosis not present

## 2021-04-29 DIAGNOSIS — S32029A Unspecified fracture of second lumbar vertebra, initial encounter for closed fracture: Secondary | ICD-10-CM | POA: Insufficient documentation

## 2021-04-29 DIAGNOSIS — S32019A Unspecified fracture of first lumbar vertebra, initial encounter for closed fracture: Secondary | ICD-10-CM | POA: Diagnosis not present

## 2021-04-29 DIAGNOSIS — K5792 Diverticulitis of intestine, part unspecified, without perforation or abscess without bleeding: Secondary | ICD-10-CM

## 2021-04-29 DIAGNOSIS — S32000A Wedge compression fracture of unspecified lumbar vertebra, initial encounter for closed fracture: Secondary | ICD-10-CM

## 2021-04-29 LAB — CBC WITH DIFFERENTIAL/PLATELET
Abs Immature Granulocytes: 0.22 10*3/uL — ABNORMAL HIGH (ref 0.00–0.07)
Basophils Absolute: 0 10*3/uL (ref 0.0–0.1)
Basophils Relative: 0 %
Eosinophils Absolute: 0.6 10*3/uL — ABNORMAL HIGH (ref 0.0–0.5)
Eosinophils Relative: 3 %
HCT: 48.1 % (ref 39.0–52.0)
Hemoglobin: 15.6 g/dL (ref 13.0–17.0)
Immature Granulocytes: 1 %
Lymphocytes Relative: 12 %
Lymphs Abs: 2.2 10*3/uL (ref 0.7–4.0)
MCH: 28.1 pg (ref 26.0–34.0)
MCHC: 32.4 g/dL (ref 30.0–36.0)
MCV: 86.5 fL (ref 80.0–100.0)
Monocytes Absolute: 0.9 10*3/uL (ref 0.1–1.0)
Monocytes Relative: 5 %
Neutro Abs: 14.1 10*3/uL — ABNORMAL HIGH (ref 1.7–7.7)
Neutrophils Relative %: 79 %
Platelets: 278 10*3/uL (ref 150–400)
RBC: 5.56 MIL/uL (ref 4.22–5.81)
RDW: 14 % (ref 11.5–15.5)
WBC: 18 10*3/uL — ABNORMAL HIGH (ref 4.0–10.5)
nRBC: 0 % (ref 0.0–0.2)

## 2021-04-29 LAB — I-STAT CHEM 8, ED
BUN: 12 mg/dL (ref 6–20)
Calcium, Ion: 1.22 mmol/L (ref 1.15–1.40)
Chloride: 106 mmol/L (ref 98–111)
Creatinine, Ser: 0.7 mg/dL (ref 0.61–1.24)
Glucose, Bld: 171 mg/dL — ABNORMAL HIGH (ref 70–99)
HCT: 48 % (ref 39.0–52.0)
Hemoglobin: 16.3 g/dL (ref 13.0–17.0)
Potassium: 3.8 mmol/L (ref 3.5–5.1)
Sodium: 141 mmol/L (ref 135–145)
TCO2: 25 mmol/L (ref 22–32)

## 2021-04-29 LAB — COMPREHENSIVE METABOLIC PANEL
ALT: 66 U/L — ABNORMAL HIGH (ref 0–44)
AST: 56 U/L — ABNORMAL HIGH (ref 15–41)
Albumin: 4.5 g/dL (ref 3.5–5.0)
Alkaline Phosphatase: 37 U/L — ABNORMAL LOW (ref 38–126)
Anion gap: 9 (ref 5–15)
BUN: 13 mg/dL (ref 6–20)
CO2: 24 mmol/L (ref 22–32)
Calcium: 9.6 mg/dL (ref 8.9–10.3)
Chloride: 109 mmol/L (ref 98–111)
Creatinine, Ser: 0.8 mg/dL (ref 0.61–1.24)
GFR, Estimated: >60 mL/min (ref >60)
Glucose, Bld: 171 mg/dL — ABNORMAL HIGH (ref 70–99)
Potassium: 3.9 mmol/L (ref 3.5–5.1)
Sodium: 142 mmol/L (ref 135–145)
Total Bilirubin: 0.5 mg/dL (ref 0.3–1.2)
Total Protein: 7.8 g/dL (ref 6.5–8.1)

## 2021-04-29 LAB — LACTIC ACID, PLASMA: Lactic Acid, Venous: 1.5 mmol/L (ref 0.5–1.9)

## 2021-04-29 MED ORDER — MORPHINE SULFATE (PF) 4 MG/ML IV SOLN
4.0000 mg | Freq: Once | INTRAVENOUS | Status: AC
  Administered 2021-04-29: 4 mg via INTRAVENOUS
  Filled 2021-04-29: qty 1

## 2021-04-29 MED ORDER — CYCLOBENZAPRINE HCL 5 MG PO TABS: 5.0000 mg | ORAL_TABLET | Freq: Three times a day (TID) | ORAL | 0 refills | Status: DC | PRN

## 2021-04-29 MED ORDER — IOHEXOL 350 MG/ML SOLN
100.0000 mL | Freq: Once | INTRAVENOUS | Status: AC | PRN
  Administered 2021-04-29: 100 mL via INTRAVENOUS

## 2021-04-29 MED ORDER — PIPERACILLIN-TAZOBACTAM 3.375 G IVPB 30 MIN
3.3750 g | Freq: Once | INTRAVENOUS | Status: AC
  Administered 2021-04-29: 3.375 g via INTRAVENOUS
  Filled 2021-04-29: qty 50

## 2021-04-29 MED ORDER — HYDROCODONE-ACETAMINOPHEN 5-325 MG PO TABS: 1.0000 | ORAL_TABLET | Freq: Four times a day (QID) | ORAL | 0 refills | Status: AC | PRN

## 2021-04-29 MED ORDER — AMOXICILLIN-POT CLAVULANATE ER 1000-62.5 MG PO TB12: 1.0000 | ORAL_TABLET | Freq: Two times a day (BID) | ORAL | 0 refills | Status: AC
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
## 2021-04-29 NOTE — ED Notes (Signed)
Patient transported to CT 
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
## 2021-05-03 ENCOUNTER — Telehealth (HOSPITAL_BASED_OUTPATIENT_CLINIC_OR_DEPARTMENT_OTHER): Payer: Self-pay | Admitting: *Deleted

## 2021-05-03 LAB — BLOOD CULTURE ID PANEL (REFLEXED) - BCID2

## 2021-05-04 LAB — CULTURE, BLOOD (SINGLE): Special Requests: ADEQUATE

## 2021-05-05 ENCOUNTER — Telehealth: Payer: Self-pay | Admitting: Emergency Medicine

## 2021-05-05 NOTE — Telephone Encounter (Signed)
Post ED Visit - Positive Culture Follow-up  Culture report reviewed by antimicrobial stewardship pharmacist: [facility] []  , Pharm.D. []  Enzo Bi, Pharm.D., BCPS AQ-ID []  , Pharm.D., BCPS []  Celedonio Miyamoto, .D., BCPS []  [facility], .D., BCPS, AAHIVP []  Georgina Pillion, Pharm.D., BCPS, AAHIVP []  [address], PharmD, BCPS []  , PharmD, BCPS []  [place], PharmD, BCPS []  [address], PharmD []  , PharmD, BCPS [x]  Estella Husk, student PharmD  Pharmacy Team []  Lysle Pearl, PharmD []  , PharmD []  Phillips Climes, PharmD []  , Rph []  [employer]) , PharmD []  Verlan Friends, PharmD []  , PharmD []  Mervyn Gay, PharmD []  , PharmD []  Johnston Ebbs, PharmD []  [facility], PharmD []  , PharmD []  Len Childs, PharmD   Positive blood culture No further patient follow-up is required at this time.  Zoha Spranger 05/05/2021, 3:56 PM
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
## 2022-06-20 ENCOUNTER — Other Ambulatory Visit: Payer: Self-pay

## 2022-06-20 ENCOUNTER — Emergency Department (HOSPITAL_COMMUNITY)
Admission: EM | Admit: 2022-06-20 | Discharge: 2022-06-21 | Disposition: A | Discharge status: Home / Self Care | Payer: BC Managed Care – PPO | Attending: Emergency Medicine | Admitting: Emergency Medicine

## 2022-06-20 ENCOUNTER — Encounter (HOSPITAL_COMMUNITY): Payer: Self-pay

## 2022-06-20 DIAGNOSIS — K5792 Diverticulitis of intestine, part unspecified, without perforation or abscess without bleeding: Secondary | ICD-10-CM

## 2022-06-20 DIAGNOSIS — R030 Elevated blood-pressure reading, without diagnosis of hypertension: Secondary | ICD-10-CM | POA: Insufficient documentation

## 2022-06-20 DIAGNOSIS — K5732 Diverticulitis of large intestine without perforation or abscess without bleeding: Secondary | ICD-10-CM | POA: Diagnosis not present

## 2022-06-20 DIAGNOSIS — R1030 Lower abdominal pain, unspecified: Secondary | ICD-10-CM | POA: Diagnosis present

## 2022-06-20 LAB — CBC WITH DIFFERENTIAL/PLATELET
Abs Immature Granulocytes: 0.07 10*3/uL (ref 0.00–0.07)
Basophils Absolute: 0.1 10*3/uL (ref 0.0–0.1)
Basophils Relative: 0 %
Eosinophils Absolute: 0.6 10*3/uL — ABNORMAL HIGH (ref 0.0–0.5)
Eosinophils Relative: 3 %
HCT: 44.7 % (ref 39.0–52.0)
Hemoglobin: 14.9 g/dL (ref 13.0–17.0)
Immature Granulocytes: 0 %
Lymphocytes Relative: 20 %
Lymphs Abs: 3.3 10*3/uL (ref 0.7–4.0)
MCH: 28.9 pg (ref 26.0–34.0)
MCHC: 33.3 g/dL (ref 30.0–36.0)
MCV: 86.6 fL (ref 80.0–100.0)
Monocytes Absolute: 1.8 10*3/uL — ABNORMAL HIGH (ref 0.1–1.0)
Monocytes Relative: 11 %
Neutro Abs: 10.7 10*3/uL — ABNORMAL HIGH (ref 1.7–7.7)
Neutrophils Relative %: 66 %
Platelets: 216 10*3/uL (ref 150–400)
RBC: 5.16 MIL/uL (ref 4.22–5.81)
RDW: 13.9 % (ref 11.5–15.5)
WBC: 16.4 10*3/uL — ABNORMAL HIGH (ref 4.0–10.5)
nRBC: 0 % (ref 0.0–0.2)

## 2022-06-20 LAB — COMPREHENSIVE METABOLIC PANEL
ALT: 28 U/L (ref 0–44)
AST: 19 U/L (ref 15–41)
Albumin: 4.2 g/dL (ref 3.5–5.0)
Alkaline Phosphatase: 33 U/L — ABNORMAL LOW (ref 38–126)
Anion gap: 8 (ref 5–15)
BUN: 12 mg/dL (ref 6–20)
CO2: 25 mmol/L (ref 22–32)
Calcium: 9.4 mg/dL (ref 8.9–10.3)
Chloride: 105 mmol/L (ref 98–111)
Creatinine, Ser: 0.86 mg/dL (ref 0.61–1.24)
GFR, Estimated: >60 mL/min (ref >60)
Glucose, Bld: 144 mg/dL — ABNORMAL HIGH (ref 70–99)
Potassium: 3.5 mmol/L (ref 3.5–5.1)
Sodium: 138 mmol/L (ref 135–145)
Total Bilirubin: 1.4 mg/dL — ABNORMAL HIGH (ref 0.3–1.2)
Total Protein: 7.7 g/dL (ref 6.5–8.1)

## 2022-06-20 LAB — URINALYSIS, ROUTINE W REFLEX MICROSCOPIC
Bacteria, UA: NONE SEEN
Bilirubin Urine: NEGATIVE
Glucose, UA: NEGATIVE mg/dL
Hgb urine dipstick: NEGATIVE
Ketones, ur: NEGATIVE mg/dL
Leukocytes,Ua: NEGATIVE
Nitrite: NEGATIVE
Protein, ur: 30 mg/dL — AB
Specific Gravity, Urine: 1.027 (ref 1.005–1.030)
pH: 7 (ref 5.0–8.0)

## 2022-06-20 LAB — LIPASE, BLOOD: Lipase: 25 U/L (ref 11–51)

## 2022-06-20 NOTE — ED Triage Notes (Signed)
Pt reports with mid lower abdominal pain and vomiting since yesterday. Pt reports having a hx of diverticulitis.

## 2022-06-21 ENCOUNTER — Encounter (HOSPITAL_COMMUNITY): Payer: Self-pay

## 2022-06-21 ENCOUNTER — Emergency Department (HOSPITAL_COMMUNITY): Payer: BC Managed Care – PPO

## 2022-06-21 MED ORDER — IOHEXOL 300 MG/ML  SOLN
100.0000 mL | Freq: Once | INTRAMUSCULAR | Status: AC | PRN
  Administered 2022-06-21: 100 mL via INTRAVENOUS

## 2022-06-21 MED ORDER — MORPHINE SULFATE (PF) 4 MG/ML IV SOLN
4.0000 mg | Freq: Once | INTRAVENOUS | Status: AC
  Administered 2022-06-21: 4 mg via INTRAVENOUS
  Filled 2022-06-21: qty 1

## 2022-06-21 MED ORDER — SODIUM CHLORIDE (PF) 0.9 % IJ SOLN
INTRAMUSCULAR | Status: AC
  Filled 2022-06-21: qty 50

## 2022-06-21 MED ORDER — AMLODIPINE BESYLATE 5 MG PO TABS: 5.0000 mg | ORAL_TABLET | Freq: Every day | ORAL | 0 refills | Status: AC

## 2022-06-21 MED ORDER — AMOXICILLIN-POT CLAVULANATE 875-125 MG PO TABS
1.0000 | ORAL_TABLET | Freq: Once | ORAL | Status: AC
  Administered 2022-06-21: 1 via ORAL
  Filled 2022-06-21: qty 1

## 2022-06-21 MED ORDER — ONDANSETRON HCL 4 MG PO TABS: 4.0000 mg | ORAL_TABLET | Freq: Four times a day (QID) | ORAL | 0 refills | Status: AC

## 2022-06-21 MED ORDER — AMOXICILLIN-POT CLAVULANATE 875-125 MG PO TABS: 1.0000 | ORAL_TABLET | Freq: Two times a day (BID) | ORAL | 0 refills | Status: AC

## 2022-06-21 MED ORDER — SODIUM CHLORIDE 0.9 % IV BOLUS
1000.0000 mL | Freq: Once | INTRAVENOUS | Status: AC
  Administered 2022-06-21: 1000 mL via INTRAVENOUS

## 2022-06-21 NOTE — Discharge Instructions (Addendum)
You were seen in the ER for evaluation of your abdominal pain. It was discovered that you have diverticulitis.  For this, you will need to be on an antibiotic that you will take twice daily for the next 7 days.  I have also include information for a gastroenterologist but I would like for you to follow-up with since this is your second time having diverticulitis.  You can take Tylenol or ibuprofen as needed for pain.  Please make sure you are staying well hydrated with plenty of fluids, mainly water.  If you have any other concerns, new or worsening symptoms, please return the nearest emergency department for evaluation.  **Additionally, you have an elevated blood pressure. I have started you on amlodipine 5mg  for you to take once daily. I have included the information on you to take your blood pressure, as well as a chart. Please make sure you are recording this. Follow up with your PCP for re-evaluation of this.   Contact a doctor if: Your pain does not get better. You are not pooping like normal. Get help right away if: Your pain gets worse. Your symptoms do not get better. Your symptoms get worse very fast. You have a fever. You vomit more than one time. You have poop that is: Bloody. Black. Tarry.

## 2022-06-21 NOTE — ED Notes (Signed)
Pt given water and crackers  

## 2022-06-21 NOTE — ED Provider Notes (Signed)
[facility] DEPT Provider Note   CSN: [id number] Arrival date & time: 06/20/22  2147     History Chief Complaint  Patient presents with   Abdominal Pain    David Beltran is a 31 y.o. male with history of diverticulitis presents the emergency department for evaluation of lower abdominal pain since yesterday.  Patient reports he had some vomiting episodes yesterday without any blood or bile.  Reports some nausea.  Denies any diarrhea reports he has been having some pebble-like stools recently.  Denies any hematochezia or melena.  Denies any fevers.  He reports this feels to the last time he had diverticulitis.  He denies any dysuria or hematuria.  No medication taken prior to arrival for pain.   Abdominal Pain Associated symptoms: constipation, nausea and vomiting   Associated symptoms: no chills, no diarrhea, no dysuria, no fever and no hematuria        Home Medications Prior to Admission medications   Medication Sig Start Date End Date Taking? Authorizing Provider  acetaminophen (TYLENOL) 500 MG tablet Take 500 mg by mouth every 6 (six) hours as needed (pain).    [provider]  cyclobenzaprine (FLEXERIL) 5 MG tablet Take 1 tablet (5 mg total) by mouth 3 (three) times daily as needed for muscle spasms. 04/29/21   Lajean Manes, MD      Allergies    Bee venom    Review of Systems   Review of Systems  Constitutional:  Negative for chills and fever.  Gastrointestinal:  Positive for abdominal pain, constipation, nausea and vomiting. Negative for anal bleeding, blood in stool and diarrhea.  Genitourinary:  Negative for dysuria and hematuria.    Physical Exam Updated Vital Signs BP (!) 144/92 (BP Location: Left Arm)   Pulse 82   Temp 98.3 F (36.8 C) (Oral)   Resp 14   Ht _0  (1.905 m)   Wt (!) 149.7 kg   SpO2 95%   BMI 41.25 kg/m  Physical Exam Vitals and nursing note reviewed.  Constitutional:      General: He is not in  acute distress.    Appearance: Normal appearance. He is not ill-appearing or toxic-appearing.  HENT:     Head: Normocephalic and atraumatic.  Eyes:     General: No scleral icterus. Cardiovascular:     Rate and Rhythm: Normal rate and regular rhythm.  Pulmonary:     Effort: Pulmonary effort is normal.     Breath sounds: Normal breath sounds.  Abdominal:     General: Bowel sounds are normal. There is no distension.     Palpations: Abdomen is soft.     Tenderness: There is abdominal tenderness in the left lower quadrant. There is no guarding or rebound.  Musculoskeletal:        General: No deformity.     Cervical back: Normal range of motion.  Skin:    General: Skin is warm and dry.  Neurological:     General: No focal deficit present.     Mental Status: He is alert. Mental status is at baseline.     ED Results / Procedures / Treatments   Labs (all labs ordered are listed, but only abnormal results are displayed) Labs Reviewed  COMPREHENSIVE METABOLIC PANEL - Abnormal; Notable for the following components:      Result Value   Glucose, Bld 144 (*)    Alkaline Phosphatase 33 (*)    Total Bilirubin 1.4 (*)    All other  components within normal limits  CBC WITH DIFFERENTIAL/PLATELET - Abnormal; Notable for the following components:   WBC 16.4 (*)    Neutro Abs 10.7 (*)    Monocytes Absolute 1.8 (*)    Eosinophils Absolute 0.6 (*)    All other components within normal limits  URINALYSIS, ROUTINE W REFLEX MICROSCOPIC - Abnormal; Notable for the following components:   Protein, ur 30 (*)    All other components within normal limits  LIPASE, BLOOD    EKG None  Radiology CT ABDOMEN PELVIS W CONTRAST  Result Date: 06/21/2022 CLINICAL DATA:  Left lower quadrant pain EXAM: CT ABDOMEN AND PELVIS WITH CONTRAST TECHNIQUE: Multidetector CT imaging of the abdomen and pelvis was performed using the standard protocol following bolus administration of intravenous contrast. RADIATION  DOSE REDUCTION: This exam was performed according to the departmental dose-optimization program which includes automated exposure control, adjustment of the mA and/or kV according to patient size and/or use of iterative reconstruction technique. CONTRAST:  181m OMNIPAQUE IOHEXOL 300 MG/ML  SOLN COMPARISON:  09/11/2019 FINDINGS: Lower chest: No acute abnormality Hepatobiliary: Diffuse low-density throughout the liver compatible with fatty infiltration. No focal abnormality. Gallbladder unremarkable. Pancreas: No focal abnormality or ductal dilatation. Spleen: No focal abnormality.  Normal size. Adrenals/Urinary Tract: Insert urinary tract Stomach/Bowel: Wall thickening and inflammatory stranding noted involving the sigmoid colon. This could reflect colitis or diverticulitis. No bowel obstruction. Stomach and small bowel decompressed, unremarkable. Normal appendix. Vascular/Lymphatic: No evidence of aneurysm or adenopathy. Reproductive: Insert male pelvis Other: Small amount of free fluid in the pelvis.  No free air. Musculoskeletal: No acute bony abnormality. IMPRESSION: Wall thickening within the sigmoid colon with surrounding inflammatory changes compatible with infectious or inflammatory colitis or diverticulitis. No definite diverticula seen. Electronically Signed   By: KRolm BaptiseM.D.   On: 06/21/2022 02:45    Procedures Procedures    Medications Ordered in ED Medications  sodium chloride 0.9 % bolus 1,000 mL (1,000 mLs Intravenous New Bag/Given 06/21/22 0245)  morphine (PF) 4 MG/ML injection 4 mg (4 mg Intravenous Given 06/21/22 0244)  iohexol (OMNIPAQUE) 300 MG/ML solution 100 mL (100 mLs Intravenous Contrast Given 06/21/22 0216)  sodium chloride (PF) 0.9 % injection (  Given by Other 06/21/22 0232)    ED Course/ Medical Decision Making/ A&P                           Medical Decision Making Amount and/or Complexity of Data Reviewed Labs: ordered. Radiology: ordered.  Risk Prescription  drug management.   31year old male presents the emergency room for evaluation of left lower quadrant pain with some nausea and vomiting.  Differential diagnosis includes but is not limited to diverticulitis, colitis, gastroenteritis, electrolyte abnormality, dehydration, small bowel obstruction.  Vital signs show significant elevated blood pressure 182/112 otherwise afebrile, normal pulse rate, satting well on room air with any increased work of breathing.  Physical exam as noted above.  Labs initiated in triage.  I independently reviewed and interpreted the patient's labs.  CMP shows glucose at 144.  He has a slightly decreased alk phos at 33 slightly elevated total bili at 1.4.  Otherwise, no electrolyte or LFT abnormalities.  Lipase within normal limits.  CBC shows left shift at 16.4, no anemia seen.  Urinalysis shows some protein otherwise unremarkable.  Given the patient's elevated white blood cell count, we will proceed forward with a CT scan to rule out any diverticulitis.  We will order the patient  a bolus of fluids.  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  I discussed with him using IV pain medications versus Tylenol or ibuprofen.  Patient like to proceed with IV pain medications.  The imaging shows wall thickening within the sigmoid colon with surrounding inflammatory changes compatible with infectious or inflammatory colitis or diverticulitis. No definite diverticula seen.  PO Challenge initiated.   Will treat with antibiotics outpatient.  We will start the patient on Augmentin twice daily for the next 7 days for treatment of his diverticulitis.  We will also start him on some Zofran for nausea as needed.  Given his elevated blood pressure while in the emergency room and, we will start him on amlodipine 5 mg to take once daily for the next 30 days until he can be evaluated again by his primary care doctor.  Blood pressure form and information on high blood pressure were  given in the discharge paperwork.  Discussed with the patient will need to follow-up with a gastroenterologist given his recurrence of diverticulitis as he may need to colonoscopy.  We discussed return precautions red flag symptoms.  Patient verbalized understanding agrees the plan.  Patient is stable being discharged home in good condition.  Final Clinical Impression(s) / ED Diagnoses Final diagnoses:  Diverticulitis  Elevated blood pressure reading    Rx / DC Orders ED Discharge Orders          Ordered    amoxicillin-clavulanate (AUGMENTIN) 875-125 MG tablet  Every 12 hours        06/21/22 0355    ondansetron (ZOFRAN) 4 MG tablet  Every 6 hours        06/21/22 0355    amLODipine (NORVASC) 5 MG tablet  Daily        06/21/22 0356              Sherrell Puller, PA-C 06/21/22 0401    Ripley Fraise, MD 06/21/22 [phone]

## 2022-11-28 ENCOUNTER — Emergency Department (HOSPITAL_COMMUNITY)
Admission: EM | Admit: 2022-11-28 | Discharge: 2022-11-28 | Disposition: A | Discharge status: Home / Self Care | Payer: Self-pay | Attending: Emergency Medicine | Admitting: Emergency Medicine

## 2022-11-28 ENCOUNTER — Other Ambulatory Visit: Payer: Self-pay

## 2022-11-28 ENCOUNTER — Emergency Department (HOSPITAL_COMMUNITY): Payer: Self-pay

## 2022-11-28 DIAGNOSIS — M5441 Lumbago with sciatica, right side: Secondary | ICD-10-CM | POA: Insufficient documentation

## 2022-11-28 MED ORDER — PREDNISONE 20 MG PO TABS: 40.0000 mg | ORAL_TABLET | Freq: Every day | ORAL | 0 refills | Status: AC

## 2022-11-28 MED ORDER — DIAZEPAM 5 MG PO TABS
5.0000 mg | ORAL_TABLET | Freq: Once | ORAL | Status: AC
  Administered 2022-11-28: 5 mg via ORAL
  Filled 2022-11-28: qty 1

## 2022-11-28 MED ORDER — KETOROLAC TROMETHAMINE 15 MG/ML IJ SOLN
15.0000 mg | Freq: Once | INTRAMUSCULAR | Status: AC
  Administered 2022-11-28: 15 mg via INTRAMUSCULAR
  Filled 2022-11-28: qty 1

## 2022-11-28 MED ORDER — OXYCODONE HCL 5 MG PO TABS
5.0000 mg | ORAL_TABLET | Freq: Once | ORAL | Status: AC
  Administered 2022-11-28: 5 mg via ORAL
  Filled 2022-11-28: qty 1

## 2022-11-28 MED ORDER — ACETAMINOPHEN 325 MG PO TABS
650.0000 mg | ORAL_TABLET | Freq: Once | ORAL | Status: AC
  Administered 2022-11-28: 650 mg via ORAL
  Filled 2022-11-28: qty 2

## 2022-11-28 MED ORDER — KETOROLAC TROMETHAMINE 15 MG/ML IJ SOLN: 15.0000 mg | Freq: Once | INTRAMUSCULAR | Status: DC

## 2022-11-28 MED ORDER — CYCLOBENZAPRINE HCL 10 MG PO TABS: 10.0000 mg | ORAL_TABLET | Freq: Three times a day (TID) | ORAL | 0 refills | Status: AC

## 2022-11-28 NOTE — Discharge Instructions (Addendum)
I have prescribed muscle relaxers for your pain, please do not drink or drive while taking this medications as it can make you drowsy.   I have also prescribed steroids, be aware this medication can cause insomnia, appetite changes.    Please follow-up with PCP in 1 week for reevaluation of your symptoms.If you experience any bowel or bladder incontinence, fever, worsening in your symptoms please return to the ED.  

## 2022-11-28 NOTE — ED Triage Notes (Signed)
C/o lower back pain radiating into right leg with numbness x5 hours that is worse with movement.  Described as stabbing sensation.  Denies urinary/bowel incontinence.

## 2022-11-28 NOTE — ED Notes (Signed)
Pt A&OX4 ambulatory at d/c with independent steady gait. Pt verbalized understand of d/c instructions, prescriptions and follow up care.

## 2022-11-28 NOTE — ED Notes (Signed)
Johana, PA informed of patient's BP of 187/109. She reports patient still ok for discharge.

## 2022-11-28 NOTE — ED Provider Notes (Signed)
[place] EMERGENCY DEPARTMENT AT [facility] Provider Note   CSN: [id number] Arrival date & time: 11/28/22  1834     History  Chief Complaint  Patient presents with   Back Pain    David Beltran is a 32 y.o. male.  32 y.o male with a PMH of Prior Disc fractures presents to the ED with a chief complaint of back pain which began today around 1 pm. He describes a stabbing sensation to his right lumbar spine radiating down his right leg. Exacerbated with sitting down and weight bearing. Has had this pain in the past but today he was unable to get rid of it. He usually takes Excedrin to help with pain, today he took 4 of these around 1 pm without any improvement in symptoms. He does have a prior hx of IV drug use, but quit 6 years ago. He denies any bowel or bladder complaint, no fever, no perianal numbness.   The history is provided by the patient.  Back Pain Associated symptoms: no fever        Home Medications Prior to Admission medications   Medication Sig Start Date End Date Taking? Authorizing Provider  cyclobenzaprine (FLEXERIL) 10 MG tablet Take 1 tablet (10 mg total) by mouth 3 (three) times daily for 5 days. 11/28/22 12/03/22 Yes Evea Sheek, Leonie Douglas, PA-C  predniSONE (DELTASONE) 20 MG tablet Take 2 tablets (40 mg total) by mouth daily for 5 days. 11/28/22 12/03/22 Yes Donevan Biller, Leonie Douglas, PA-C  acetaminophen (TYLENOL) 500 MG tablet Take 500 mg by mouth every 6 (six) hours as needed (pain).    [provider]  amLODipine (NORVASC) 5 MG tablet Take 1 tablet (5 mg total) by mouth daily. 06/21/22   Achille Rich, PA-C  amoxicillin-clavulanate (AUGMENTIN) 875-125 MG tablet Take 1 tablet by mouth every 12 (twelve) hours. 06/21/22   Achille Rich, PA-C  ondansetron (ZOFRAN) 4 MG tablet Take 1 tablet (4 mg total) by mouth every 6 (six) hours. 06/21/22   Achille Rich, PA-C      Allergies    Bee venom    Review of Systems   Review of Systems  Constitutional:  Negative for  chills and fever.  Respiratory:  Negative for shortness of breath.   Genitourinary:  Negative for difficulty urinating.  Musculoskeletal:  Positive for back pain.  All other systems reviewed and are negative.   Physical Exam Updated Vital Signs BP (!) 171/113   Pulse 79   Temp 98.2 F (36.8 C)   Resp 20   Wt (!) 149 kg   SpO2 98%   BMI 41.06 kg/m  Physical Exam Vitals and nursing note reviewed.  Constitutional:      Appearance: Normal appearance. He is obese. He is not ill-appearing.  HENT:     Head: Normocephalic and atraumatic.     Mouth/Throat:     Mouth: Mucous membranes are moist.  Cardiovascular:     Rate and Rhythm: Normal rate.  Pulmonary:     Effort: Pulmonary effort is normal.  Abdominal:     General: Abdomen is flat.  Musculoskeletal:     Cervical back: Normal range of motion.     Lumbar back: Tenderness present. Normal range of motion.     Comments: RLE- KF,KE 5/5 strength LLE- HF, HE 5/5 strength Antalgic gait. No pronator drift. No leg drop.  CN I, II and VIII not tested. CN II-XII grossly intact bilaterally.      Skin:    General: Skin is warm  and dry.  Neurological:     Mental Status: He is alert and oriented to person, place, and time.     ED Results / Procedures / Treatments   Labs (all labs ordered are listed, but only abnormal results are displayed) Labs Reviewed - No data to display  EKG None  Radiology DG Lumbar Spine Complete  Result Date: 11/28/2022 CLINICAL DATA:  Back pain, right radicular pain EXAM: LUMBAR SPINE - COMPLETE 4+ VIEW COMPARISON:  09/14/2017 FINDINGS: No recent fracture is seen. Alignment of posterior margins of vertebral bodies is within normal limits. Degenerative changes are noted in facet joints, more so at the L5-S1 level. Small anterior bony spurs are seen. Paraspinal soft tissues are unremarkable. IMPRESSION: No recent fracture is seen.  Degenerative changes are noted. Electronically Signed   By: Ernie Avena M.D.   On: 11/28/2022 19:57    Procedures Procedures    Medications Ordered in ED Medications  diazepam (VALIUM) tablet 5 mg (5 mg Oral Given 11/28/22 2002)  acetaminophen (TYLENOL) tablet 650 mg (650 mg Oral Given 11/28/22 2002)  ketorolac (TORADOL) 15 MG/ML injection 15 mg (15 mg Intramuscular Given 11/28/22 2020)  oxyCODONE (Oxy IR/ROXICODONE) immediate release tablet 5 mg (5 mg Oral Given 11/28/22 2033)    ED Course/ Medical Decision Making/ A&P                             Medical Decision Making Amount and/or Complexity of Data Reviewed Radiology: ordered.  Risk OTC drugs. Prescription drug management.    Patient presents to the ED with a chief complaint of low back pain which has been ongoing since 1 AM today, described it as sharp stabbing nature to the right lower quadrant radiating down his right leg.  Prior symptoms similar to prior episodes of back pain, reports a prior history of fractures however no exacerbation of his symptoms due to trauma, no fevers, no IV drug use, no history of cancer, no urinary symptoms.  Antalgic gait noted on my exam, has good bilateral strength to lower extremities.  Orts no red flags.  Does describe this as a shooting sensation from the right lumbar spine all the way down his right leg, concern for sciatica.  X-ray was obtained which did not show any acute findings or hx of prior fractures. Given medication to help with pain control with some improvement, we discussed continue supportive treatment will follow up with Spine team. Return precautions discussed at length.    Portions of this note were generated with Scientist, clinical (histocompatibility and immunogenetics). Dictation errors may occur despite best attempts at proofreading.   Final Clinical Impression(s) / ED Diagnoses Final diagnoses:  Acute right-sided low back pain with right-sided sciatica    Rx / DC Orders ED Discharge Orders          Ordered    cyclobenzaprine (FLEXERIL) 10 MG tablet  3  times daily        11/28/22 2030    predniSONE (DELTASONE) 20 MG tablet  Daily        11/28/22 2030              Claude Manges, PA-C 11/28/22 2037    Loetta Rough, MD 11/28/22 2111

## 2024-08-27 ENCOUNTER — Emergency Department (HOSPITAL_BASED_OUTPATIENT_CLINIC_OR_DEPARTMENT_OTHER)
Admission: EM | Admit: 2024-08-27 | Discharge: 2024-08-27 | Disposition: A | Discharge status: Home / Self Care | Payer: Self-pay | Attending: Emergency Medicine | Admitting: Emergency Medicine

## 2024-08-27 ENCOUNTER — Encounter (HOSPITAL_BASED_OUTPATIENT_CLINIC_OR_DEPARTMENT_OTHER): Payer: Self-pay

## 2024-08-27 ENCOUNTER — Other Ambulatory Visit: Payer: Self-pay

## 2024-08-27 DIAGNOSIS — M545 Low back pain, unspecified: Secondary | ICD-10-CM | POA: Insufficient documentation

## 2024-08-27 MED ORDER — METHOCARBAMOL 500 MG PO TABS: 1000.0000 mg | ORAL_TABLET | Freq: Three times a day (TID) | ORAL | 0 refills | Status: AC | PRN

## 2024-08-27 MED ORDER — LIDOCAINE 5 % EX PTCH: 1.0000 | MEDICATED_PATCH | CUTANEOUS | 0 refills | Status: AC

## 2024-08-27 MED ORDER — HYDROCODONE-ACETAMINOPHEN 5-325 MG PO TABS
1.0000 | ORAL_TABLET | Freq: Once | ORAL | Status: AC
  Administered 2024-08-27: 1 via ORAL
  Filled 2024-08-27: qty 1

## 2024-08-27 MED ORDER — NAPROXEN 500 MG PO TABS: 500.0000 mg | ORAL_TABLET | Freq: Two times a day (BID) | ORAL | 0 refills | Status: AC

## 2024-08-27 MED ORDER — KETOROLAC TROMETHAMINE 15 MG/ML IJ SOLN
15.0000 mg | Freq: Once | INTRAMUSCULAR | Status: AC
  Administered 2024-08-27: 15 mg via INTRAMUSCULAR
  Filled 2024-08-27: qty 1

## 2024-08-27 NOTE — ED Triage Notes (Signed)
 Patient arrives with back pain. Noticed a pop one week ago, he states that it has been bothering him and spasms since then. Difficulty walking and sitting. He denies any urinary or bowel problems. Reports hx of vertebral fracture and sciatic symptoms. Worse with bending and exertion.

## 2024-08-27 NOTE — Discharge Instructions (Signed)
 Please read and follow all provided instructions.  Your diagnoses today include:  1. Acute right-sided low back pain without sciatica    Tests performed today include: Vital signs - see below for your results today  Medications prescribed:  Robaxin  (methocarbamol ) - muscle relaxer medication  DO NOT drive or perform any activities that require you to be awake and alert because this medicine can make you drowsy.   Naproxen  - anti-inflammatory pain medication Do not exceed 500mg  naproxen  every 12 hours, take with food  You have been prescribed an anti-inflammatory medication or NSAID. Take with food. Take smallest effective dose for the shortest duration needed for your pain. Stop taking if you experience stomach pain or vomiting.   Take any prescribed medications only as directed.  Home care instructions:  Follow any educational materials contained in this packet Please rest, use ice or heat on your back for the next several days Do not lift, push, pull anything more than 10 pounds for the next week  Follow-up instructions: Please follow-up with your primary care provider in the next 1 week for further evaluation of your symptoms if not improving.  Return instructions:  SEEK IMMEDIATE MEDICAL ATTENTION IF YOU HAVE: New numbness, tingling, weakness, or problem with the use of your arms or legs Severe back pain not relieved with medications Loss control of your bowels or bladder Increasing pain in any areas of the body (such as chest or abdominal pain) Shortness of breath, dizziness, or fainting.  Worsening nausea (feeling sick to your stomach), vomiting, fever, or sweats Any other emergent concerns regarding your health   Additional Information:  Your vital signs today were: BP (!) 168/85 (BP Location: Right Arm)   Pulse 85   Temp 98.2 F (36.8 C) (Oral)   Resp 17   SpO2 97%  If your blood pressure (BP) was elevated above 135/85 this visit, please have this repeated by  your doctor within one month. --------------
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
